# Patient Record
Sex: Male | Born: 1955
Health system: Southern US, Community
[De-identification: ages and names within clinical notes are randomized; demographics above are authoritative.]

## PROBLEM LIST (undated history)

## (undated) DIAGNOSIS — N200 Calculus of kidney: Secondary | ICD-10-CM

## (undated) DIAGNOSIS — I2510 Atherosclerotic heart disease of native coronary artery without angina pectoris: Secondary | ICD-10-CM

## (undated) DIAGNOSIS — A159 Respiratory tuberculosis unspecified: Secondary | ICD-10-CM

## (undated) DIAGNOSIS — D126 Benign neoplasm of colon, unspecified: Secondary | ICD-10-CM

## (undated) DIAGNOSIS — N138 Other obstructive and reflux uropathy: Secondary | ICD-10-CM

## (undated) DIAGNOSIS — H6123 Impacted cerumen, bilateral: Secondary | ICD-10-CM

## (undated) DIAGNOSIS — E785 Hyperlipidemia, unspecified: Secondary | ICD-10-CM

## (undated) DIAGNOSIS — H9190 Unspecified hearing loss, unspecified ear: Secondary | ICD-10-CM

## (undated) DIAGNOSIS — N401 Enlarged prostate with lower urinary tract symptoms: Secondary | ICD-10-CM

## (undated) DIAGNOSIS — Z87442 Personal history of urinary calculi: Secondary | ICD-10-CM

## (undated) DIAGNOSIS — Z9889 Other specified postprocedural states: Secondary | ICD-10-CM

## (undated) HISTORY — DX: Benign neoplasm of colon, unspecified: D12.6

## (undated) HISTORY — DX: Unspecified hearing loss, unspecified ear: H91.90

## (undated) HISTORY — DX: Calculus of kidney: N20.0

## (undated) HISTORY — DX: Impacted cerumen, bilateral: H61.23

## (undated) HISTORY — DX: Hyperlipidemia, unspecified: E78.5

## (undated) HISTORY — DX: Benign prostatic hyperplasia with lower urinary tract symptoms: N40.1

## (undated) HISTORY — DX: Benign prostatic hyperplasia with lower urinary tract symptoms: N13.8

---

## 2013-09-08 ENCOUNTER — Other Ambulatory Visit (HOSPITAL_COMMUNITY): Payer: Self-pay | Admitting: Internal Medicine

## 2013-09-08 ENCOUNTER — Ambulatory Visit (HOSPITAL_COMMUNITY)
Admission: RE | Admit: 2013-09-08 | Discharge: 2013-09-08 | Disposition: A | Discharge status: Home / Self Care | Payer: BC Managed Care – PPO | Source: Ambulatory Visit | Attending: Vascular Surgery | Admitting: Vascular Surgery

## 2013-09-08 DIAGNOSIS — Z0000 Encounter for general adult medical examination without abnormal findings: Secondary | ICD-10-CM | POA: Insufficient documentation

## 2013-09-08 DIAGNOSIS — Z8249 Family history of ischemic heart disease and other diseases of the circulatory system: Secondary | ICD-10-CM | POA: Insufficient documentation

## 2013-10-14 ENCOUNTER — Encounter (HOSPITAL_COMMUNITY): Payer: Self-pay | Admitting: Emergency Medicine

## 2013-10-14 ENCOUNTER — Emergency Department (HOSPITAL_COMMUNITY): Payer: BC Managed Care – PPO

## 2013-10-14 ENCOUNTER — Emergency Department (HOSPITAL_COMMUNITY)
Admission: EM | Admit: 2013-10-14 | Discharge: 2013-10-14 | Disposition: A | Discharge status: Home / Self Care | Payer: BC Managed Care – PPO | Attending: Emergency Medicine | Admitting: Emergency Medicine

## 2013-10-14 DIAGNOSIS — N23 Unspecified renal colic: Secondary | ICD-10-CM | POA: Diagnosis not present

## 2013-10-14 DIAGNOSIS — R112 Nausea with vomiting, unspecified: Secondary | ICD-10-CM | POA: Diagnosis not present

## 2013-10-14 DIAGNOSIS — Z87442 Personal history of urinary calculi: Secondary | ICD-10-CM | POA: Diagnosis not present

## 2013-10-14 DIAGNOSIS — N200 Calculus of kidney: Secondary | ICD-10-CM | POA: Insufficient documentation

## 2013-10-14 DIAGNOSIS — M549 Dorsalgia, unspecified: Secondary | ICD-10-CM | POA: Diagnosis not present

## 2013-10-14 DIAGNOSIS — Z87891 Personal history of nicotine dependence: Secondary | ICD-10-CM | POA: Insufficient documentation

## 2013-10-14 HISTORY — DX: Calculus of kidney: N20.0

## 2013-10-14 LAB — URINALYSIS, ROUTINE W REFLEX MICROSCOPIC
Bilirubin Urine: NEGATIVE
Glucose, UA: NEGATIVE mg/dL
Ketones, ur: NEGATIVE mg/dL
LEUKOCYTES UA: NEGATIVE
Nitrite: NEGATIVE
PROTEIN: NEGATIVE mg/dL
SPECIFIC GRAVITY, URINE: 1.016 (ref 1.005–1.030)
UROBILINOGEN UA: 0.2 mg/dL (ref 0.0–1.0)
pH: 8 (ref 5.0–8.0)

## 2013-10-14 LAB — CBC WITH DIFFERENTIAL/PLATELET
BASOS PCT: 0 % (ref 0–1)
Basophils Absolute: 0 10*3/uL (ref 0.0–0.1)
EOS PCT: 0 % (ref 0–5)
Eosinophils Absolute: 0 10*3/uL (ref 0.0–0.7)
HEMATOCRIT: 45.4 % (ref 39.0–52.0)
Hemoglobin: 15.7 g/dL (ref 13.0–17.0)
LYMPHS PCT: 9 % — AB (ref 12–46)
Lymphs Abs: 0.9 10*3/uL (ref 0.7–4.0)
MCH: 29.8 pg (ref 26.0–34.0)
MCHC: 34.6 g/dL (ref 30.0–36.0)
MCV: 86.1 fL (ref 78.0–100.0)
MONO ABS: 0.2 10*3/uL (ref 0.1–1.0)
Monocytes Relative: 2 % — ABNORMAL LOW (ref 3–12)
Neutro Abs: 8.6 10*3/uL — ABNORMAL HIGH (ref 1.7–7.7)
Neutrophils Relative %: 89 % — ABNORMAL HIGH (ref 43–77)
PLATELETS: 220 10*3/uL (ref 150–400)
RBC: 5.27 MIL/uL (ref 4.22–5.81)
RDW: 12.1 % (ref 11.5–15.5)
WBC: 9.7 10*3/uL (ref 4.0–10.5)

## 2013-10-14 LAB — COMPREHENSIVE METABOLIC PANEL
ALT: 18 U/L (ref 0–53)
AST: 27 U/L (ref 0–37)
Albumin: 4.1 g/dL (ref 3.5–5.2)
Alkaline Phosphatase: 81 U/L (ref 39–117)
Anion gap: 13 (ref 5–15)
BUN: 19 mg/dL (ref 6–23)
CO2: 25 meq/L (ref 19–32)
CREATININE: 1.33 mg/dL (ref 0.50–1.35)
Calcium: 9.5 mg/dL (ref 8.4–10.5)
Chloride: 100 mEq/L (ref 96–112)
GFR calc Af Amer: 66 mL/min — ABNORMAL LOW (ref >90)
GFR, EST NON AFRICAN AMERICAN: 57 mL/min — AB (ref >90)
Glucose, Bld: 140 mg/dL — ABNORMAL HIGH (ref 70–99)
Potassium: 4.2 mEq/L (ref 3.7–5.3)
SODIUM: 138 meq/L (ref 137–147)
Total Bilirubin: 0.5 mg/dL (ref 0.3–1.2)
Total Protein: 7.7 g/dL (ref 6.0–8.3)

## 2013-10-14 LAB — URINE MICROSCOPIC-ADD ON

## 2013-10-14 MED ORDER — ONDANSETRON 4 MG PO TBDP: ORAL_TABLET | ORAL | Status: DC

## 2013-10-14 MED ORDER — HYDROMORPHONE HCL PF 1 MG/ML IJ SOLN
1.0000 mg | Freq: Once | INTRAMUSCULAR | Status: AC
  Administered 2013-10-14: 1 mg via INTRAVENOUS
  Filled 2013-10-14: qty 1

## 2013-10-14 MED ORDER — TAMSULOSIN HCL 0.4 MG PO CAPS: 0.8000 mg | ORAL_CAPSULE | Freq: Every day | ORAL | Status: DC

## 2013-10-14 MED ORDER — HYDROMORPHONE HCL PF 1 MG/ML IJ SOLN: 1.0000 mg | Freq: Once | INTRAMUSCULAR | Status: DC

## 2013-10-14 MED ORDER — TAMSULOSIN HCL 0.4 MG PO CAPS
0.8000 mg | ORAL_CAPSULE | Freq: Every day | ORAL | Status: DC
  Administered 2013-10-14: 0.8 mg via ORAL
  Filled 2013-10-14: qty 2

## 2013-10-14 MED ORDER — KETOROLAC TROMETHAMINE 30 MG/ML IJ SOLN
30.0000 mg | Freq: Once | INTRAMUSCULAR | Status: AC
  Administered 2013-10-14: 30 mg via INTRAVENOUS
  Filled 2013-10-14: qty 1

## 2013-10-14 MED ORDER — ONDANSETRON HCL 4 MG/2ML IJ SOLN
4.0000 mg | Freq: Once | INTRAMUSCULAR | Status: AC
  Administered 2013-10-14: 4 mg via INTRAVENOUS
  Filled 2013-10-14: qty 2

## 2013-10-14 MED ORDER — TAMSULOSIN HCL 0.4 MG PO CAPS: 0.4000 mg | ORAL_CAPSULE | Freq: Every day | ORAL | Status: DC

## 2013-10-14 MED ORDER — OXYCODONE-ACETAMINOPHEN 5-325 MG PO TABS: 1.0000 | ORAL_TABLET | ORAL | Status: DC | PRN

## 2013-10-14 NOTE — ED Provider Notes (Signed)
CSN: [id number]     Arrival date & time 10/14/13  1006 History   First MD Initiated Contact with Patient 10/14/13 1012     Chief Complaint  Patient presents with  . Nephrolithiasis    HPI BRENTTON WARDLOW is a 58 y.o. male with PMH of nephrolithiasis complaining of severe left flank pain that radiates into his penis and left testicle. Pain started a few hours ago and is not increasing or decreasing. Patient has severe nausea and has been vomiting since he woke up this morning. Emesis is nonbloody nonbilious. Patient last had a kidney stone 7 years ago. He had a KUB which showed multiple stones in the kidneys. No history of obstruction or stent. Patient denies other abdominal pain, dysuria hematuria or frequency. No chest pain, shortness of breath, weakness, headaches. Patient states Toradol as worked for him in the past for kidney stone pain.  Past Medical History  Diagnosis Date  . Kidney stones    History reviewed. No pertinent past surgical history. No family history on file. History  Substance Use Topics  . Smoking status: Former Research scientist (life sciences)  . Smokeless tobacco: Not on file  . Alcohol Use: 4.2 oz/week    7 Cans of beer per week    Review of Systems  Constitutional: Negative for fever and chills.  HENT: Negative for congestion and rhinorrhea.   Eyes: Negative for visual disturbance.  Respiratory: Negative for cough and shortness of breath.   Cardiovascular: Negative for chest pain and palpitations.  Gastrointestinal: Positive for nausea and vomiting. Negative for diarrhea and blood in stool.  Genitourinary: Negative for dysuria, hematuria, difficulty urinating and penile pain.  Musculoskeletal: Positive for back pain. Negative for gait problem.  Skin: Negative for rash.  Neurological: Negative for weakness and headaches.      Allergies  Review of patient's allergies indicates no known allergies.  Home Medications   Prior to Admission medications   Medication Sig Start Date  End Date Taking? Authorizing Provider  ketorolac (TORADOL) 10 MG tablet Take 10 mg by mouth every 6 (six) hours as needed for moderate pain.   Yes Historical Provider, MD  ondansetron (ZOFRAN ODT) 4 MG disintegrating tablet 4mg  ODT q4 hours prn nausea/vomit 10/14/13   Margarita Mail, PA-C  oxyCODONE-acetaminophen (PERCOCET) 5-325 MG per tablet Take 1-2 tablets by mouth every 4 (four) hours as needed. 10/14/13   Margarita Mail, PA-C  tamsulosin (FLOMAX) 0.4 MG CAPS capsule Take 1 capsule (0.4 mg total) by mouth daily after supper. 10/14/13   Margarita Mail, PA-C   BP 123/73  Pulse 56  Temp(Src) 97.3 F (36.3 C) (Oral)  Resp 18  Ht 5' 11.5" (1.816 m)  Wt 190 lb (86.183 kg)  BMI 26.13 kg/m2  SpO2 96% Physical Exam  Nursing note and vitals reviewed. Constitutional: He appears well-developed and well-nourished. No distress.  HENT:  Head: Normocephalic and atraumatic.  Eyes: Conjunctivae and EOM are normal. Right eye exhibits no discharge. Left eye exhibits no discharge. No scleral icterus.  Cardiovascular: Normal rate, regular rhythm and normal heart sounds.   Pulmonary/Chest: Effort normal. No respiratory distress.  Abdominal: Soft. Bowel sounds are normal. He exhibits no distension. There is no tenderness.  Left CVA tenderness.  Musculoskeletal: Normal range of motion. He exhibits no tenderness.  Neurological: He is alert. He exhibits normal muscle tone. Coordination normal.  Skin: Skin is warm and dry. He is not diaphoretic.  Psychiatric: He has a normal mood and affect. His behavior is normal.  ED Course  Procedures (including critical care time) Labs Review Labs Reviewed  CBC WITH DIFFERENTIAL - Abnormal; Notable for the following:    Neutrophils Relative % 89 (*)    Neutro Abs 8.6 (*)    Lymphocytes Relative 9 (*)    Monocytes Relative 2 (*)    All other components within normal limits  COMPREHENSIVE METABOLIC PANEL - Abnormal; Notable for the following:    Glucose, Bld 140  (*)    GFR calc non Af Amer 57 (*)    GFR calc Af Amer 66 (*)    All other components within normal limits  URINALYSIS, ROUTINE W REFLEX MICROSCOPIC - Abnormal; Notable for the following:    APPearance CLOUDY (*)    Hgb urine dipstick SMALL (*)    All other components within normal limits  URINE MICROSCOPIC-ADD ON    Imaging Review US Renal  10/14/2013   CLINICAL DATA:  Nephrolithiasis.  EXAM: RENAL/URINARY TRACT ULTRASOUND COMPLETE  COMPARISON:  None.  FINDINGS: Right Kidney:  Length: 11.0 cm. The central sinus echo complex is normal without hydronephrosis. 3 mm calculus is present in the inferior pole collecting system.  Left Kidney:  Length: 12.0 cm. Normal central sinus echo complex without hydronephrosis. 6 mm calculus is present in the inferior pole collecting system, with faint posterior acoustic shadowing.  Bladder:  Appears normal for degree of bladder distention.  IMPRESSION: Small sub cm bilateral renal collecting system calculi. No hydronephrosis.   Electronically Signed   By: Dereck Ligas M.D.   On: 10/14/2013 13:31     EKG Interpretation None     Meds given in ED:  Medications  ondansetron (ZOFRAN) injection 4 mg (4 mg Intravenous Given 10/14/13 1115)  HYDROmorphone (DILAUDID) injection 1 mg (1 mg Intravenous Given 10/14/13 1115)  ketorolac (TORADOL) 30 MG/ML injection 30 mg (30 mg Intravenous Given 10/14/13 1351)    Discharge Medication List as of 10/14/2013  1:26 PM    START taking these medications   Details  ondansetron (ZOFRAN ODT) 4 MG disintegrating tablet 4mg  ODT q4 hours prn nausea/vomit, Print    oxyCODONE-acetaminophen (PERCOCET) 5-325 MG per tablet Take 1-2 tablets by mouth every 4 (four) hours as needed., Starting 10/14/2013, Until Discontinued, Print    tamsulosin (FLOMAX) 0.4 MG CAPS capsule Take 1 capsule (0.4 mg total) by mouth daily after supper., Starting 10/14/2013, Until Discontinued, Print          MDM   Final diagnoses:  Ureteral colic    Patient is a 58 year old male with past medical history of nephrolithiasis complaining of severe left flank pain that radiates into his penis and left testicle. Patient has a history of kidney stones 7 years ago. He never needed any type of stent or hydronephrosis. Patient is afebrile with normal vitals. Patient given Zofran and dilaudid with resolution of pain and nausea. UA without signs of infection. Renal US without sounds of hydronephrosis. Small bilateral calculi in renal collecting system. Patient tolerated PO challenge. Patient is afebrile, nontoxic, and in no acute distress. Patient is appropriate for outpatient management and is stable for discharge. Patient given toradol for longer lasting pain relief. Discharged with Percocet, zofran, and flomax.   Discussed return precautions with patient. Discussed all results and patient verbalizes understanding and agrees with plan.     Pura Spice, PA-C 10/14/13 1801

## 2013-10-14 NOTE — ED Notes (Signed)
Patient is from home. Patient states he has been having N/V and pain left flank radiating to penis and left testicle.

## 2013-10-14 NOTE — Discharge Instructions (Signed)
Return to the ED immediately if you develop fever, uncontrolled pain or vomiting, or other concerns.   Kidney Stones Kidney stones (urolithiasis) are deposits that form inside your kidneys. The intense pain is caused by the stone moving through the urinary tract. When the stone moves, the ureter goes into spasm around the stone. The stone is usually passed in the urine.  CAUSES   A disorder that makes certain neck glands produce too much parathyroid hormone (primary hyperparathyroidism).  A buildup of uric acid crystals, similar to gout in your joints.  Narrowing (stricture) of the ureter.  A kidney obstruction present at birth (congenital obstruction).  Previous surgery on the kidney or ureters.  Numerous kidney infections. SYMPTOMS   Feeling sick to your stomach (nauseous).  Throwing up (vomiting).  Blood in the urine (hematuria).  Pain that usually spreads (radiates) to the groin.  Frequency or urgency of urination. DIAGNOSIS   Taking a history and physical exam.  Blood or urine tests.  CT scan.  Occasionally, an examination of the inside of the urinary bladder (cystoscopy) is performed. TREATMENT   Observation.  Increasing your fluid intake.  Extracorporeal shock wave lithotripsy--This is a noninvasive procedure that uses shock waves to break up kidney stones.  Surgery may be needed if you have severe pain or persistent obstruction. There are various surgical procedures. Most of the procedures are performed with the use of small instruments. Only small incisions are needed to accommodate these instruments, so recovery time is minimized. The size, location, and chemical composition are all important variables that will determine the proper choice of action for you. Talk to your health care provider to better understand your situation so that you will minimize the risk of injury to yourself and your kidney.  HOME CARE INSTRUCTIONS   Drink enough water and fluids to  keep your urine clear or pale yellow. This will help you to pass the stone or stone fragments.  Strain all urine through the provided strainer. Keep all particulate matter and stones for your health care provider to see. The stone causing the pain may be as small as a grain of salt. It is very important to use the strainer each and every time you pass your urine. The collection of your stone will allow your health care provider to analyze it and verify that a stone has actually passed. The stone analysis will often identify what you can do to reduce the incidence of recurrences.  Only take over-the-counter or prescription medicines for pain, discomfort, or fever as directed by your health care provider.  Make a follow-up appointment with your health care provider as directed.  Get follow-up X-rays if required. The absence of pain does not always mean that the stone has passed. It may have only stopped moving. If the urine remains completely obstructed, it can cause loss of kidney function or even complete destruction of the kidney. It is your responsibility to make sure X-rays and follow-ups are completed. Ultrasounds of the kidney can show blockages and the status of the kidney. Ultrasounds are not associated with any radiation and can be performed easily in a matter of minutes. SEEK MEDICAL CARE IF:  You experience pain that is progressive and unresponsive to any pain medicine you have been prescribed. SEEK IMMEDIATE MEDICAL CARE IF:   Pain cannot be controlled with the prescribed medicine.  You have a fever or shaking chills.  The severity or intensity of pain increases over 18 hours and is not relieved by  pain medicine.  You develop a new onset of abdominal pain.  You feel faint or pass out.  You are unable to urinate. MAKE SURE YOU:   Understand these instructions.  Will watch your condition.  Will get help right away if you are not doing well or get worse. Document Released:  02/05/2005 Document Revised: 10/08/2012 Document Reviewed: 07/09/2012 [facility] Patient Information 2015 [place], [place]. This information is not intended to replace advice given to you by your health care provider. Make sure you discuss any questions you have with your health care provider.

## 2013-10-16 ENCOUNTER — Encounter (HOSPITAL_COMMUNITY): Admission: EM | Disposition: A | Discharge status: Home / Self Care | Payer: Self-pay | Source: Ambulatory Visit

## 2013-10-16 ENCOUNTER — Emergency Department (HOSPITAL_COMMUNITY): Admission: EM | Admit: 2013-10-16 | Payer: BC Managed Care – PPO

## 2013-10-16 ENCOUNTER — Ambulatory Visit (HOSPITAL_COMMUNITY): Payer: BC Managed Care – PPO | Admitting: Registered Nurse

## 2013-10-16 ENCOUNTER — Ambulatory Visit: Payer: Self-pay | Admitting: Urology

## 2013-10-16 ENCOUNTER — Encounter (HOSPITAL_COMMUNITY): Payer: BC Managed Care – PPO | Admitting: Registered Nurse

## 2013-10-16 ENCOUNTER — Ambulatory Visit (HOSPITAL_COMMUNITY)
Admission: EM | Admit: 2013-10-16 | Discharge: 2013-10-16 | Disposition: A | Discharge status: Home / Self Care | Payer: BC Managed Care – PPO | Source: Ambulatory Visit | Attending: Urology | Admitting: Urology

## 2013-10-16 ENCOUNTER — Encounter (HOSPITAL_COMMUNITY): Payer: Self-pay | Admitting: Registered Nurse

## 2013-10-16 DIAGNOSIS — Z79899 Other long term (current) drug therapy: Secondary | ICD-10-CM | POA: Diagnosis not present

## 2013-10-16 DIAGNOSIS — N201 Calculus of ureter: Secondary | ICD-10-CM | POA: Diagnosis present

## 2013-10-16 DIAGNOSIS — Z87891 Personal history of nicotine dependence: Secondary | ICD-10-CM | POA: Diagnosis not present

## 2013-10-16 DIAGNOSIS — Z7982 Long term (current) use of aspirin: Secondary | ICD-10-CM | POA: Insufficient documentation

## 2013-10-16 DIAGNOSIS — N135 Crossing vessel and stricture of ureter without hydronephrosis: Secondary | ICD-10-CM | POA: Insufficient documentation

## 2013-10-16 DIAGNOSIS — R7611 Nonspecific reaction to tuberculin skin test without active tuberculosis: Secondary | ICD-10-CM | POA: Insufficient documentation

## 2013-10-16 HISTORY — PX: HOLMIUM LASER APPLICATION: SHX5852

## 2013-10-16 HISTORY — PX: CYSTOSCOPY WITH RETROGRADE PYELOGRAM, URETEROSCOPY AND STENT PLACEMENT: SHX5789

## 2013-10-16 SURGERY — CYSTOURETEROSCOPY, WITH RETROGRADE PYELOGRAM AND STENT INSERTION
Anesthesia: General | Laterality: Bilateral

## 2013-10-16 MED ORDER — FENTANYL CITRATE 0.05 MG/ML IJ SOLN
25.0000 ug | INTRAMUSCULAR | Status: DC | PRN
  Administered 2013-10-16 (×2): 25 ug via INTRAVENOUS

## 2013-10-16 MED ORDER — TAMSULOSIN HCL 0.4 MG PO CAPS
0.4000 mg | ORAL_CAPSULE | Freq: Once | ORAL | Status: DC
  Filled 2013-10-16: qty 1

## 2013-10-16 MED ORDER — LIDOCAINE HCL 2 % EX GEL
CUTANEOUS | Status: DC | PRN
  Administered 2013-10-16: 1 via URETHRAL

## 2013-10-16 MED ORDER — ACETAMINOPHEN 10 MG/ML IV SOLN
INTRAVENOUS | Status: DC | PRN
  Administered 2013-10-16: 1000 mg via INTRAVENOUS

## 2013-10-16 MED ORDER — FENTANYL CITRATE 0.05 MG/ML IJ SOLN
INTRAMUSCULAR | Status: AC
  Filled 2013-10-16: qty 5

## 2013-10-16 MED ORDER — OXYCODONE HCL 5 MG/5ML PO SOLN: 5.0000 mg | Freq: Once | ORAL | Status: AC | PRN

## 2013-10-16 MED ORDER — PHENAZOPYRIDINE HCL 200 MG PO TABS
200.0000 mg | ORAL_TABLET | Freq: Once | ORAL | Status: AC
  Administered 2013-10-16: 200 mg via ORAL

## 2013-10-16 MED ORDER — DEXAMETHASONE SODIUM PHOSPHATE 10 MG/ML IJ SOLN
INTRAMUSCULAR | Status: DC | PRN
  Administered 2013-10-16: 10 mg via INTRAVENOUS

## 2013-10-16 MED ORDER — MEPERIDINE HCL 50 MG/ML IJ SOLN: 6.2500 mg | INTRAMUSCULAR | Status: DC | PRN

## 2013-10-16 MED ORDER — ONDANSETRON HCL 4 MG/2ML IJ SOLN
INTRAMUSCULAR | Status: AC
  Filled 2013-10-16: qty 2

## 2013-10-16 MED ORDER — IOHEXOL 300 MG/ML  SOLN
INTRAMUSCULAR | Status: DC | PRN
  Administered 2013-10-16: 9 mL via URETHRAL

## 2013-10-16 MED ORDER — ACETAMINOPHEN 10 MG/ML IV SOLN
1000.0000 mg | Freq: Once | INTRAVENOUS | Status: DC
  Filled 2013-10-16: qty 100

## 2013-10-16 MED ORDER — LIDOCAINE HCL (CARDIAC) 20 MG/ML IV SOLN
INTRAVENOUS | Status: AC
  Filled 2013-10-16: qty 5

## 2013-10-16 MED ORDER — MIDAZOLAM HCL 5 MG/5ML IJ SOLN
INTRAMUSCULAR | Status: DC | PRN
  Administered 2013-10-16: 2 mg via INTRAVENOUS

## 2013-10-16 MED ORDER — LACTATED RINGERS IV SOLN
INTRAVENOUS | Status: DC | PRN
  Administered 2013-10-16 (×2): via INTRAVENOUS

## 2013-10-16 MED ORDER — LIDOCAINE HCL 2 % EX GEL
CUTANEOUS | Status: AC
  Filled 2013-10-16: qty 10

## 2013-10-16 MED ORDER — PROPOFOL 10 MG/ML IV BOLUS
INTRAVENOUS | Status: AC
  Filled 2013-10-16: qty 20

## 2013-10-16 MED ORDER — SODIUM CHLORIDE 0.9 % IJ SOLN
INTRAMUSCULAR | Status: AC
  Filled 2013-10-16: qty 10

## 2013-10-16 MED ORDER — PHENAZOPYRIDINE HCL 200 MG PO TABS
ORAL_TABLET | ORAL | Status: AC
  Filled 2013-10-16: qty 1

## 2013-10-16 MED ORDER — KETOROLAC TROMETHAMINE 30 MG/ML IJ SOLN
INTRAMUSCULAR | Status: DC | PRN
  Administered 2013-10-16: 30 mg via INTRAVENOUS

## 2013-10-16 MED ORDER — CIPROFLOXACIN IN D5W 400 MG/200ML IV SOLN
INTRAVENOUS | Status: DC | PRN
  Administered 2013-10-16: 400 mg via INTRAVENOUS

## 2013-10-16 MED ORDER — BELLADONNA ALKALOIDS-OPIUM 16.2-60 MG RE SUPP
RECTAL | Status: DC | PRN
  Administered 2013-10-16: 1 via RECTAL

## 2013-10-16 MED ORDER — OXYCODONE-ACETAMINOPHEN 10-325 MG PO TABS: 1.0000 | ORAL_TABLET | ORAL | Status: DC | PRN

## 2013-10-16 MED ORDER — FENTANYL CITRATE 0.05 MG/ML IJ SOLN
INTRAMUSCULAR | Status: DC | PRN
  Administered 2013-10-16 (×5): 50 ug via INTRAVENOUS

## 2013-10-16 MED ORDER — OXYCODONE HCL 5 MG PO TABS
5.0000 mg | ORAL_TABLET | Freq: Once | ORAL | Status: AC | PRN
Start: 2013-10-16 — End: 2013-10-16
  Administered 2013-10-16: 5 mg via ORAL

## 2013-10-16 MED ORDER — BELLADONNA ALKALOIDS-OPIUM 16.2-60 MG RE SUPP
RECTAL | Status: AC
  Filled 2013-10-16: qty 1

## 2013-10-16 MED ORDER — CIPROFLOXACIN IN D5W 400 MG/200ML IV SOLN
INTRAVENOUS | Status: AC
  Filled 2013-10-16: qty 200

## 2013-10-16 MED ORDER — LIDOCAINE HCL (CARDIAC) 10 MG/ML IV SOLN
INTRAVENOUS | Status: DC | PRN
  Administered 2013-10-16: 100 mg via INTRAVENOUS

## 2013-10-16 MED ORDER — SODIUM CHLORIDE 0.9 % IR SOLN
Status: DC | PRN
  Administered 2013-10-16: 4000 mL

## 2013-10-16 MED ORDER — PROPOFOL 10 MG/ML IV BOLUS
INTRAVENOUS | Status: DC | PRN
  Administered 2013-10-16: 200 mg via INTRAVENOUS

## 2013-10-16 MED ORDER — OXYCODONE HCL 5 MG PO TABS
ORAL_TABLET | ORAL | Status: AC
  Filled 2013-10-16: qty 1

## 2013-10-16 MED ORDER — ONDANSETRON HCL 4 MG/2ML IJ SOLN
INTRAMUSCULAR | Status: DC | PRN
  Administered 2013-10-16: 4 mg via INTRAVENOUS

## 2013-10-16 MED ORDER — DEXAMETHASONE SODIUM PHOSPHATE 10 MG/ML IJ SOLN
INTRAMUSCULAR | Status: AC
  Filled 2013-10-16: qty 1

## 2013-10-16 MED ORDER — PROMETHAZINE HCL 25 MG/ML IJ SOLN: 6.2500 mg | INTRAMUSCULAR | Status: DC | PRN

## 2013-10-16 MED ORDER — PHENAZOPYRIDINE HCL 200 MG PO TABS: 200.0000 mg | ORAL_TABLET | Freq: Three times a day (TID) | ORAL | Status: DC | PRN

## 2013-10-16 MED ORDER — FENTANYL CITRATE 0.05 MG/ML IJ SOLN
INTRAMUSCULAR | Status: AC
  Filled 2013-10-16: qty 2

## 2013-10-16 MED ORDER — MIDAZOLAM HCL 2 MG/2ML IJ SOLN
INTRAMUSCULAR | Status: AC
  Filled 2013-10-16: qty 2

## 2013-10-16 SURGICAL SUPPLY — 19 items
BAG URO CATCHER STRL LF (DRAPE) ×2 IMPLANT
BASKET ZERO TIP NITINOL 2.4FR (BASKET) IMPLANT
CATH INTERMIT  6FR 70CM (CATHETERS) ×2 IMPLANT
CLOTH BEACON ORANGE TIMEOUT ST (SAFETY) ×2 IMPLANT
DRAPE CAMERA CLOSED 9X96 (DRAPES) ×2 IMPLANT
EXTRACTOR STONE NITINOL NGAGE (UROLOGICAL SUPPLIES) ×2 IMPLANT
FIBER LASER FLEXIVA 200 (UROLOGICAL SUPPLIES) ×2 IMPLANT
GLOVE BIOGEL M 8.0 STRL (GLOVE) ×6 IMPLANT
GOWN STRL REUS W/ TWL XL LVL3 (GOWN DISPOSABLE) ×1 IMPLANT
GOWN STRL REUS W/TWL XL LVL3 (GOWN DISPOSABLE) ×3 IMPLANT
GUIDEWIRE ANG ZIPWIRE 038X150 (WIRE) IMPLANT
GUIDEWIRE STR DUAL SENSOR (WIRE) ×4 IMPLANT
KIT BALLIN UROMAX 15FX10 (LABEL) ×1 IMPLANT
MANIFOLD NEPTUNE II (INSTRUMENTS) ×2 IMPLANT
PACK CYSTO (CUSTOM PROCEDURE TRAY) ×2 IMPLANT
SET HIGH PRES BAL DIL (LABEL) ×1
SHEATH ACCESS URETERAL 38CM (SHEATH) ×2 IMPLANT
STENT URET 6FRX24 CONTOUR (STENTS) ×4 IMPLANT
TUBING CONNECTING 10 (TUBING) ×2 IMPLANT

## 2013-10-16 NOTE — Anesthesia Preprocedure Evaluation (Addendum)
Anesthesia Evaluation  Patient identified by MRN, date of birth, ID band Patient awake    Reviewed: Allergy & Precautions, H&P , NPO status , Patient's Chart, lab work & pertinent test results  Airway Mallampati: I TM Distance: >3 FB Neck ROM: Full    Dental no notable dental hx. (+) Teeth Intact   Pulmonary former smoker,  breath sounds clear to auscultation  Pulmonary exam normal       Cardiovascular negative cardio ROS  Rhythm:Regular Rate:Normal     Neuro/Psych negative neurological ROS  negative psych ROS   GI/Hepatic negative GI ROS, Neg liver ROS,   Endo/Other  negative endocrine ROS  Renal/GU Renal diseaseHx/o renal calculi Bilateral ureteral calculi  negative genitourinary   Musculoskeletal negative musculoskeletal ROS (+)   Abdominal   Peds negative pediatric ROS (+)  Hematology negative hematology ROS (+)   Anesthesia Other Findings   Reproductive/Obstetrics negative OB ROS                          Anesthesia Physical Anesthesia Plan  ASA: II  Anesthesia Plan: General   Post-op Pain Management:    Induction: Intravenous  Airway Management Planned: LMA  Additional Equipment:   Intra-op Plan:   Post-operative Plan: Extubation in OR  Informed Consent: I have reviewed the patients History and Physical, chart, labs and discussed the procedure including the risks, benefits and alternatives for the proposed anesthesia with the patient or authorized representative who has indicated his/her understanding and acceptance.   Dental advisory given  Plan Discussed with: CRNA, Anesthesiologist and Surgeon  Anesthesia Plan Comments:        Anesthesia Quick Evaluation

## 2013-10-16 NOTE — ED Provider Notes (Signed)
Medical screening examination/treatment/procedure(s) were performed by non-physician practitioner and as supervising physician I was immediately available for consultation/collaboration.   Leota Jacobsen, MD 10/16/13 [phone]

## 2013-10-16 NOTE — H&P (Signed)
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 He was last seen in 2010. He has been asymptomatic since passing his last stone approximately 10 years ago until 2 days ago when he developed severe left-sided flank pain with radiation to his left lower quadrant and left testis. He presented to the emergency department where he underwent a renal ultrasound which did not demonstrate hydronephrosis but did demonstrate some left-sided perinephric fluid consistent with a possible forniceal rupture. He did not undergo any further imaging at that time. He was treated symptomatically and his pain has been relatively well-controlled over the last 2 days with Percocet and Zofran. He has denied any fever. He denies any right-sided flank pain or abdominal pain.   Past Medical History Problems  1. History of kidney stones (V13.01) 2. History of Tuberculin PPD Induration Positive Interpretation (795.5)  Surgical History Problems  1. History of Colonoscopy (Fiberoptic) 2. History of Neck Surgery  Current Meds 1. Aspirin 81 MG Oral Tablet;  Therapy: (Recorded:18Nov2010) to Recorded 2. Tamsulosin HCl - 0.4 MG Oral Capsule;  Therapy: (Recorded:28Aug2015) to Recorded 3. Toradol Oral 10 MG TABS (Ketorolac Tromethamine);  Therapy: (Recorded:28Aug2015) to Recorded  Allergies Medication  1. No Known Drug Allergies  Family History Problems  1. Family history of Colon Cancer (V16.0) : Father 2. Family history of Prostate Cancer (K53.97) : Father  Social History Problems    Alcohol Use   1 or 2 a week   Former smoker [place])   Marital History - Currently Married   Occupation:   Psychologist, sport and exercise   Tobacco use (305.1)   1/4 pk a day for 6 yrs; quit 33 yrs ago  Review of Systems  Genitourinary: no hematuria.  Gastrointestinal: nausea.  Constitutional: no fever.   Hematologic/Lymphatic: no tendency to easily bruise.  Cardiovascular: no leg swelling.    Vitals Vital Signs [Data Includes: Last 1 Day]  Recorded: 28Aug2015 04:43PM  Height: 5 ft 11.5 in Weight: 188 lb  BMI Calculated: 25.86 BSA Calculated: 2.06  Physical Exam Constitutional: Well nourished and well developed . No acute distress.  ENT:. The ears and nose are normal in appearance.  Neck: The appearance of the neck is normal and no neck mass is present.  Pulmonary: No respiratory distress, normal respiratory rhythm and effort and clear bilateral breath sounds.  Cardiovascular: Heart rate and rhythm are normal . No peripheral edema.  Abdomen: The abdomen is soft and nontender. No masses are palpated. No CVA tenderness. No hernias are palpable. No hepatosplenomegaly noted.  Genitourinary: Examination of the penis demonstrates no discharge, no masses, no lesions and a normal meatus. The scrotum is without lesions. The right epididymis is palpably normal and non-tender. The left epididymis is palpably normal and non-tender. The right testis is non-tender and without masses. The left testis is non-tender and without masses.  Lymphatics: The femoral and inguinal nodes are not enlarged or tender.  Skin: Normal skin turgor, no visible rash and no visible skin lesions.  Neuro/Psych:. Mood and affect are appropriate.    Results/Data Selected Results  AU CT-STONE PROTOCOL 28Aug2015 12:00AM Raynelle Bring   Test Name Result Flag Reference  CT-STONE PROTOCOL     Diagnostic Images Are Available In PACS For This Exam.    I reviewed his medical records and laboratory results from his emergency room visit on 10/14/13. Serum creatinine was 1.33 and urinalysis demonstrated 7-10 red blood cells.  I independently reviewed a CT stone study today. This demonstrates fairly large burden nonobstructing renal calculi including the largest 1.3 cm left lower pole calculus. He also appears to have a  proximal left ureteral calculus measuring 4-5 mm and a proximal right ureteral calculus measuring approximately 6 mm. Both stones have Hounsfield units of approximately 1000. There is no significant hydronephrosis noted.  Assessment Assessed  1. Ureteral stone (592.1)  Discussion/Summary 1. Bilateral ureteral calculi: I reviewed the CT scan Pilar Plate today. Although he does not have complete obstruction, he does have bilateral ureteral stones and we discussed the risk of developing acute renal failure. We also discussed the probability that he would pass the stone on each side. Considering his desire to avoid time out of work and to avoid further pain symptoms, he does wish to proceed with definitive therapy. We reviewed the potential options including percutaneous therapy, shockwave lithotripsy, and ureteroscopic laser lithotripsy. After reviewing the pros and cons of each approach, he is most interested in proceeding with bilateral ureteroscopic laser lithotripsy. He also understands the increased risk of operating on both renal units. After reviewing the potential risks, complications, expected recovery process as well as the need for postoperative ureteral stenting, he gives his informed consent to proceed. This will be performed tonight. I have discussed this case with Dr. Karsten Ro who is on-call tonight. He is agreeable to perform this procedure.    Cc: Dr. Crist Infante     Signatures Electronically signed by : Raynelle Bring, M.D.; Oct 16 2013  5:14PM EST

## 2013-10-16 NOTE — ED Notes (Signed)
Pt sent to ED for OR check list. OR ready for patient.

## 2013-10-16 NOTE — Op Note (Signed)
PATIENT:  Jeffery Hunter  PRE-OPERATIVE DIAGNOSIS:  Bilateral ureteral calculi  POST-OPERATIVE DIAGNOSIS: 1. Bilateral ureteral calculi 2. Bilateral ureteral strictures  PROCEDURE:  1. . oscopy with bilateral retrograde pyelograms including interpretation. 2. Left ureteroscopy, laser lithotripsy and stone extraction. 3. Right ureteroscopy, laser lithotripsy and stone extraction. 4. 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 A renal ultrasound was performed that revealed no hydronephrosis and no stone was seen. No other imaging studies were performed. He was seen in the office today and a CT scan revealed a 4 mm ureteral stone located at the left ureteropelvic junction. On the right-hand side there was a 6 mm stone located near the ureteropelvic junction as well. He is brought to the operating room today for bilateral ureteroscopic management of his stones.  ANESTHESIA:  General  EBL:  less than 50 mL  DRAINS: 6 French, 24 cm double-J stents in both right and left ureters (both stents have strings)  SPECIMEN:  Stone given to patient  DESCRIPTION OF PROCEDURE: The patient was taken to the major OR and placed on the table. General anesthesia was administered and then the patient was moved to the dorsal lithotomy position. The genitalia was sterilely prepped and draped. An official timeout was performed.  Initially the 30 French cystoscope with 12 lens was passed under direct vision. The bladder was then entered and fully inspected. It was noted be free of any tumors stones or inflammatory lesions. Ureteral orifices were of normal configuration and position. A 6 French open-ended ureteral catheter was then passed through the cystoscope into the ureteral orifice in order to perform a left retrograde pyelogram.  A retrograde pyelogram was performed by injecting full-strength contrast up the  left ureter under direct fluoroscopic control. It revealed a filling defect in the ureter consistent with the stone seen on the preoperative CT. The remainder of the ureter was noted to be normal as was the intrarenal collecting system. I then passed a 0.038 inch floppy-tipped guidewire through the open ended catheter and into the area of the renal pelvis and this was left in place. The inner portion of a ureteral access sheath was then passed over the guidewire to gently dilate the ureter up to the stone but I was unable to pass the inner portion beyond about halfway up the ureter. I therefore removed this and passed the ureteral dilating balloon over the guidewire and positioned at in the location where I was having difficulty advancing the previous access sheath and dilated the ureter. I then removed the balloon and reinserted the access sheath and was able to pass this further but still had some difficulty. I therefore removed the access sheath once again and passed the digital ureteroscope over the guidewire and up the ureter to the point where I was meeting resistance and found an area of whitish scar tissue consistent with ureteral scarring/stricture. I therefore removed the ureteroscope, passed the dilating balloon across this area and dilated. I was then able to pass the access sheath up to the level of the UPJ where the stone was previously located and passed the digital ureteroscope through the access sheath.  I was able to identify the stone and it had fallen back into the renal pelvis. I used a 200  holmium laser fiber to fragment the stone into 2 portions and then used a Nitinol basket to grasp the stone fragments and remove them. I  then passed a guidewire through the access sheath and left this in place removing the access sheath. The guidewire was secured to the drape and I then directed my attention to the right ureter.  A right retrograde pyelogram was performed in an identical fashion as  described above. This also revealed no definite abnormality of the ureter. The stone could be seen as a filling defect again. I then passed a guidewire, attempted to dilate this ureter with the inner portion of a ureteral access sheath and again noted significant resistance at about the mid ureteral level. I therefore dilated this area as well with the ureteral dilating balloon and then was able to pass the access sheath easily up to the level of the stone. I again used the 200  holmium laser fiber to fragment the stone. I grasped the fragments with a Nitinol basket and extracted these without difficulty as well. Again the guidewire was passed into the area the renal pelvis and the access sheath removed.  I then backloaded the cystoscope over the guidewire on the right side and passed the stent over the guidewire into the area of the renal pelvis. As the guidewire was removed good curl was noted in the renal pelvis. I then proceeded to place a stent on the left side in an identical fashion. At the end of the procedure I checked and made sure there was good curl in the renal pelvis on both right and left sides as well as in the bladder. The bladder was drained and the cystoscope was then removed. I instilled 2% lidocaine jelly in the urethra and applied a penile clamp. The tethers were then affixed to the dorsum of the penis with tape. The patient tolerated the procedure well no intraoperative complications.  PLAN OF CARE: Discharge to home after PACU  PATIENT DISPOSITION:  PACU - hemodynamically stable.       And a I

## 2013-10-16 NOTE — Transfer of Care (Signed)
Immediate Anesthesia Transfer of Care Note  Patient: Jeffery Hunter  Procedure(s) Performed: Procedure(s): CYSTOSCOPY WITH BILATERAL RETROGRADE PYELOGRAM, BILATERAL URETEROSCOPY AND bilateral STENT PLACEMENT, bilateral urethral dilation HOLMIUM LASER (Bilateral) HOLMIUM LASER APPLICATION (Bilateral)  Patient Location: PACU  Anesthesia Type:General  Level of Consciousness: awake, alert , oriented and patient cooperative  Airway & Oxygen Therapy: Patient Spontanous Breathing and Patient connected to face mask oxygen  Post-op Assessment: Report given to PACU RN, Post -op Vital signs reviewed and stable and Patient moving all extremities X 4  Post vital signs: stable  Complications: No apparent anesthesia complications

## 2013-10-16 NOTE — Discharge Instructions (Signed)
Post stone removal/stent placement surgery instructions   Definitions:  Ureter: The duct that transports urine from the kidney to the bladder. Stent: A plastic hollow tube that is placed into the ureter, from the kidney to the bladder to prevent the ureter from swelling shut.  General instructions:  Despite the fact that no skin incisions were used, the area around the ureter and bladder is raw and irritated. The stent is a foreign body which will further irritate the bladder wall. This irritation is manifested by increased frequency of urination, both day and night, and by an increase in the urge to urinate. In some, the urge to urinate is present almost always. Sometimes the urge is strong enough that you may not be able to stop your self from urinating. The only real cure is to remove the stent and then give time for the bladder wall to heal which can't be done until the danger of the ureter swelling shut has passed. (This varies from 2-21 days).  You may see some blood in your urine while the stent is in place and a few days afterward. Do not be alarmed, even if the urine is clear for a while. Get off your feet and drink lots of fluids until clearing occurs. If you start to pass clots or don't improve, call us.  If you have a string coming from your urethra:  The stent string is attached to your ureteral stent.  Do not pull on thisIf you have a string coming from your urethra:  The stent string is attached to your ureteral stent.  Do not pull on this.  Diet:  You may return to your normal diet immediately. Because of the raw surface of your bladder, alcohol, spicy foods, foods high in acid and drinks with caffeine may cause irritation or frequency and should be used in moderation. To keep your urine flowing freely and avoid constipation, drink plenty of fluids during the day (8-10 glasses). Tip: Avoid cranberry juice because it is very acidic.  Activity:  Your physical activity doesn't need  to be restricted. However, if you are very active, you may see some blood in the urine. We suggest that you reduce your activity under the circumstances until the bleeding has stopped.  Bowels:  It is important to keep your bowels regular during the postoperative period. Straining with bowel movements can cause bleeding. A bowel movement every other day is reasonable. Use a mild laxative if needed, such as milk of magnesia 2-3 tablespoons, or 2 Dulcolax tablets. Call if you continue to have problems. If you had been taking narcotics for pain, before, during or after your surgery, you may be constipated. Take a laxative if necessary.     Medication:  You should resume your pre-surgery medications unless told not to. DO NOT RESUME YOUR ASPIRIN, or any other medicines like ibuprofen, motrin, excedrin, advil, aleve, vitamin E, fish oil as these can all cause bleeding x 7 days. In addition you may be given an antibiotic to prevent or treat infection. Antibiotics are not always necessary. All medication should be taken as prescribed until the bottles are finished unless you are having an unusual reaction to one of the drugs.  Problems you should report to [place]:  a. Fever greater than 101F. b. Heavy bleeding, or clots (see notes above about blood in urine). c. Inability to urinate. d. Drug reactions (hives, rash, nausea, vomiting, diarrhea). e. Severe burning or pain with urination that is not improving.  Followup:  You will need a followup appointment to monitor your progress in most cases. Please call the office for this appointment when you get home if your appointment has not already been scheduled. Usually the first appointment will be about 5-14 days after your surgery and if you have a stent in place it will likely be removed at that time. ° °Post Anesthesia Home Care Instructions ° °Activity: °Get plenty of rest for the remainder of the day. A responsible adult should stay with you for 24  hours following the procedure.  °For the next 24 hours, DO NOT: °-Drive a car °-Operate machinery °-Drink alcoholic beverages °-Take any medication unless instructed by your physician °-Make any legal decisions or sign important papers. ° °Meals: °Start with liquid foods such as gelatin or soup. Progress to regular foods as tolerated. Avoid greasy, spicy, heavy foods. If nausea and/or vomiting occur, drink only clear liquids until the nausea and/or vomiting subsides. Call your physician if vomiting continues. ° °Special Instructions/Symptoms: °Your throat may feel dry or sore from the anesthesia or the breathing tube placed in your throat during surgery. If this causes discomfort, gargle with warm salt water. The discomfort should disappear within 24 hours. ° ° °

## 2013-10-16 NOTE — Anesthesia Postprocedure Evaluation (Signed)
  Anesthesia Post-op Note  Patient: Jeffery Hunter  Procedure(s) Performed: Procedure(s): CYSTOSCOPY WITH BILATERAL RETROGRADE PYELOGRAM, BILATERAL URETEROSCOPY AND bilateral STENT PLACEMENT, bilateral urethral dilation HOLMIUM LASER (Bilateral) HOLMIUM LASER APPLICATION (Bilateral)  Patient Location: PACU  Anesthesia Type:General  Level of Consciousness: awake, alert  and oriented  Airway and Oxygen Therapy: Patient Spontanous Breathing  Post-op Pain: mild  Post-op Assessment: Post-op Vital signs reviewed, Patient's Cardiovascular Status Stable, Respiratory Function Stable, Patent Airway, No signs of Nausea or vomiting and Pain level controlled  Post-op Vital Signs: Reviewed and stable  Last Vitals:  Filed Vitals:   10/16/13 2130  BP: 146/77  Pulse: 69  Temp:   Resp: 11    Complications: No apparent anesthesia complications

## 2013-10-18 NOTE — Progress Notes (Signed)
PACU note: Pt. Discharged to home prior to wasting fentanyl 50 mcg/1 ml. Witnessed by Unknown Foley rn

## 2013-10-19 ENCOUNTER — Encounter (HOSPITAL_COMMUNITY): Payer: Self-pay | Admitting: Urology

## 2016-11-12 ENCOUNTER — Encounter: Payer: Self-pay | Admitting: Interventional Cardiology

## 2016-11-13 ENCOUNTER — Other Ambulatory Visit: Payer: Self-pay | Admitting: Internal Medicine

## 2016-11-13 DIAGNOSIS — R9431 Abnormal electrocardiogram [ECG] [EKG]: Secondary | ICD-10-CM

## 2016-11-13 DIAGNOSIS — R000 Tachycardia, unspecified: Secondary | ICD-10-CM

## 2016-11-22 ENCOUNTER — Other Ambulatory Visit (HOSPITAL_COMMUNITY): Payer: Self-pay

## 2016-12-06 ENCOUNTER — Other Ambulatory Visit (HOSPITAL_COMMUNITY): Payer: Self-pay

## 2016-12-27 ENCOUNTER — Other Ambulatory Visit: Payer: Self-pay

## 2016-12-27 ENCOUNTER — Encounter (INDEPENDENT_AMBULATORY_CARE_PROVIDER_SITE_OTHER): Payer: Self-pay

## 2016-12-27 ENCOUNTER — Ambulatory Visit (HOSPITAL_COMMUNITY): Payer: BLUE CROSS/BLUE SHIELD | Attending: Cardiology

## 2016-12-27 DIAGNOSIS — R9431 Abnormal electrocardiogram [ECG] [EKG]: Secondary | ICD-10-CM

## 2016-12-27 DIAGNOSIS — R000 Tachycardia, unspecified: Secondary | ICD-10-CM

## 2016-12-27 DIAGNOSIS — I420 Dilated cardiomyopathy: Secondary | ICD-10-CM | POA: Insufficient documentation

## 2016-12-27 DIAGNOSIS — I5030 Unspecified diastolic (congestive) heart failure: Secondary | ICD-10-CM | POA: Insufficient documentation

## 2017-01-02 ENCOUNTER — Encounter: Payer: Self-pay | Admitting: *Deleted

## 2017-01-02 NOTE — Progress Notes (Addendum)
Cardiology Office Note   Date:  01/03/2017   ID:  Teren, Zurcher [birth year], MRN [id number]  PCP:  Crist Infante, MD    No chief complaint on file. abnormal echo   Wt Readings from Last 3 Encounters:  01/03/17 202 lb 9.6 oz (91.9 kg)  10/14/13 190 lb (86.2 kg)       History of Present Illness: Jeffery Hunter is a 61 y.o. male who is being seen today for the evaluation of abnormal echo at the request of Crist Infante, MD.   He had an echocardiogram showing: - Left ventricle: The cavity size was normal. Wall thickness was   increased in a pattern of mild LVH. The estimated ejection   fraction was 50%. Basal to mid inferior hypokinesis. Doppler   parameters are consistent with abnormal left ventricular   relaxation (grade 1 diastolic dysfunction).  He exercises for 1 hour 5-6 days/ weeks.   He had an episode of palpitations 6 months ago that resolved with carotid massage.  He had an ECG after this and there was a question of inferior Q waves which prompted echo.    Mild calcification of the coronary artery on prior CT.   Past Medical History:  Diagnosis Date  . Adenomatous polyp of colon   . BPH with urinary obstruction   . High frequency hearing loss   . Hyperlipemia   . Impacted cerumen of both ears   . Kidney stones   . Nephrolithiasis     Past Surgical History:  Procedure Laterality Date  . CYSTOSCOPY WITH RETROGRADE PYELOGRAM, URETEROSCOPY AND STENT PLACEMENT Bilateral 10/16/2013   Procedure: CYSTOSCOPY WITH BILATERAL RETROGRADE PYELOGRAM, BILATERAL URETEROSCOPY AND bilateral STENT PLACEMENT, bilateral urethral dilation HOLMIUM LASER;  Surgeon: Claybon Jabs, MD;  Location: [facility];  Service: Urology;  Laterality: Bilateral;  . HOLMIUM LASER APPLICATION Bilateral 2/70/3500   Procedure: HOLMIUM LASER APPLICATION;  Surgeon: Claybon Jabs, MD;  Location: [facility];  Service: Urology;  Laterality: Bilateral;     Current Outpatient Medications  Medication Sig  Dispense Refill  . meloxicam (MOBIC) 15 MG tablet Take 15 mg as needed by mouth for pain.     No current facility-administered medications for this visit.     Allergies:   Patient has no known allergies.    Social History:  The patient  reports that he has quit smoking. he has never used smokeless tobacco. He reports that he drinks about 4.2 oz of alcohol per week. He reports that he does not use drugs.   Family History:  The patient's family history includes AAA (abdominal aortic aneurysm) in his maternal grandfather; Alcohol abuse in his maternal grandmother, mother, and paternal grandfather; Alzheimer's disease in his mother; Breast cancer in his paternal grandmother; Colon polyps in his father; HIV in his brother; Healthy in his daughter, daughter, grandchild, and son; Heart Problems in his father and maternal grandfather; Hypertension in his mother; Prostate cancer in his father.  No early CAD.  Father did have bypass surgery in his 45s.   ROS:  Please see the history of present illness.   Otherwise, review of systems are positive for occasional leg edema at the end of the day.   All other systems are reviewed and negative.    PHYSICAL EXAM: VS:  BP (!) 152/96   Pulse 68   Ht 5\' 11"  (1.803 m)   Wt 202 lb 9.6 oz (91.9 kg)   SpO2 94%   BMI 28.26 kg/m  ,  BMI Body mass index is 28.26 kg/m. GEN: Well nourished, well developed, in no acute distress  HEENT: normal  Neck: no JVD, carotid bruits, or masses Cardiac: RRR; no murmurs, rubs, or gallops,no edema  Respiratory:  clear to auscultation bilaterally, normal work of breathing GI: soft, nontender, nondistended, + BS MS: no deformity or atrophy  Skin: warm and dry, no rash Neuro:  Strength and sensation are intact Psych: euthymic mood, full affect   EKG:   The ekg ordered today demonstrates sinus bradycardia, Q waves in lead III and aVF, no other ST segment changes   Recent Labs: No results found for requested labs within  last 8760 hours.   Lipid Panel No results found for: CHOL, TRIG, HDL, CHOLHDL, VLDL, LDLCALC, LDLDIRECT   Other studies Reviewed: Additional studies/ records that were reviewed today with results demonstrating: Prior ECG reviewed, LDL 120 in September 2018.   ASSESSMENT AND PLAN:  1. Abnormal echocardiogram: Question of inferior hypokinesis.  I personally reviewed the echocardiogram.  Ejection fraction appears to be at the lower end of the normal range.  There is certainly no evidence of akinesis, or thinning/scarring.  He reports that he was ill at the time of the echo.  He exercises at a high level getting his heart rate into the 150s and 160 range without any cardiac symptoms.  We discussed further evaluation with cardiac cath, but given his lack of symptoms, will hold off.  We also discussed stress testing.  What we decided on would be a repeat of his echocardiogram in about 6 months.  We will see if there is any change.  Certainly, if there is any change in his symptoms, would plan on further evaluation.  Continue aspirin for now. 2. Abnormal ECG: He has an old ECG from many years ago to compare.  We will see if there were any inferior Q waves present in the past. 3. Continue regular exercise. 4. Lipids adequately controlled for his level of risk factor. 5. Palpitations: One episode about 6 months ago.  Monitor would be low yield at this time.  He will let us know if he has any further symptoms.   Current medicines are reviewed at length with the patient today.  The patient concerns regarding his medicines were addressed.  The following changes have been made:  No change  Labs/ tests ordered today include:   Orders Placed This Encounter  Procedures  . ECHOCARDIOGRAM COMPLETE    Recommend 150 minutes/week of aerobic exercise Low fat, low carb, high fiber diet recommended  Disposition:   FU in 6 months   Signed, Larae Grooms, MD  01/03/2017 9:49 AM    [facility] [place], [place], [place]  [address] Phone: [phone]; Fax: [phone]

## 2017-01-03 ENCOUNTER — Encounter: Payer: Self-pay | Admitting: Interventional Cardiology

## 2017-01-03 ENCOUNTER — Ambulatory Visit: Payer: BLUE CROSS/BLUE SHIELD | Admitting: Interventional Cardiology

## 2017-01-03 VITALS — BP 152/96 | HR 68 | Ht 71.0 in | Wt 202.6 lb

## 2017-01-03 DIAGNOSIS — R931 Abnormal findings on diagnostic imaging of heart and coronary circulation: Secondary | ICD-10-CM

## 2017-01-03 DIAGNOSIS — R002 Palpitations: Secondary | ICD-10-CM

## 2017-01-03 DIAGNOSIS — R9431 Abnormal electrocardiogram [ECG] [EKG]: Secondary | ICD-10-CM

## 2017-01-03 NOTE — Patient Instructions (Signed)
Medication Instructions:  Your physician recommends that you continue on your current medications as directed. Please refer to the Current Medication list given to you today.   Labwork: None ordered  Testing/Procedures: Your physician has requested that you have an echocardiogram in 6 months. Echocardiography is a painless test that uses sound waves to create images of your heart. It provides your doctor with information about the size and shape of your heart and how well your heart's chambers and valves are working. This procedure takes approximately one hour. There are no restrictions for this procedure.    Follow-Up: Your physician wants you to follow-up in: 6 months with Dr. Irish Lack. You will receive a reminder letter in the mail two months in advance. If you don't receive a letter, please call our office to schedule the follow-up appointment.   Any Other Special Instructions Will Be Listed Below (If Applicable).     If you need a refill on your cardiac medications before your next appointment, please call your pharmacy.

## 2017-07-01 ENCOUNTER — Other Ambulatory Visit: Payer: Self-pay

## 2017-07-01 ENCOUNTER — Ambulatory Visit (HOSPITAL_COMMUNITY): Payer: BLUE CROSS/BLUE SHIELD | Attending: Internal Medicine

## 2017-07-01 DIAGNOSIS — R931 Abnormal findings on diagnostic imaging of heart and coronary circulation: Secondary | ICD-10-CM | POA: Insufficient documentation

## 2017-08-19 ENCOUNTER — Ambulatory Visit: Payer: BLUE CROSS/BLUE SHIELD | Admitting: Interventional Cardiology

## 2017-08-19 ENCOUNTER — Encounter

## 2017-10-23 ENCOUNTER — Encounter

## 2017-12-24 ENCOUNTER — Other Ambulatory Visit: Payer: Self-pay | Admitting: Internal Medicine

## 2017-12-24 DIAGNOSIS — E785 Hyperlipidemia, unspecified: Secondary | ICD-10-CM

## 2018-01-03 ENCOUNTER — Ambulatory Visit
Admission: RE | Admit: 2018-01-03 | Discharge: 2018-01-03 | Disposition: A | Discharge status: Home / Self Care | Payer: No Typology Code available for payment source | Source: Ambulatory Visit | Attending: Internal Medicine | Admitting: Internal Medicine

## 2018-01-03 DIAGNOSIS — E785 Hyperlipidemia, unspecified: Secondary | ICD-10-CM

## 2018-01-07 ENCOUNTER — Telehealth: Payer: Self-pay

## 2018-01-07 DIAGNOSIS — R931 Abnormal findings on diagnostic imaging of heart and coronary circulation: Secondary | ICD-10-CM

## 2018-01-07 NOTE — Telephone Encounter (Signed)
Patient returning call. Reviewed instructions for Cardiac CT below with the patient. Patient's resting HR is 45-58. No BB needed per Dr. Varanasi. Patient with recent BMET on 11/1 done with PCP showing BUN-17, Cr- 1.1, and eGFR- 67.4. Patient understands that he will be contacted regarding scheduling once pre-certed.   Please arrive at the [facility] main entrance of [facility] on ____ at _____ AM (30-45 minutes prior to test start time)  [facility] [address] [place], [place] [address] [phone]  Proceed to the [facility] (First Floor).  Please follow these instructions carefully (unless otherwise directed):    On the Night Before the Test: . Be sure to Drink plenty of water. . Do not consume any caffeinated/decaffeinated beverages or chocolate 12 hours prior to your test. . Do not take any antihistamines 12 hours prior to your test.   On the Day of the Test: . Drink plenty of water. Do not drink any water within one hour of the test. . Do not eat any food 4 hours prior to the test. . You may take your regular medications prior to the test.         After the Test: . Drink plenty of water. . After receiving IV contrast, you may experience a mild flushed feeling. This is normal. . On occasion, you may experience a mild rash up to 24 hours after the test. This is not dangerous. If this occurs, you can take Benadryl 25 mg and increase your fluid intake. . If you experience trouble breathing, this can be serious. If it is severe call 911 IMMEDIATELY. If it is mild, please call our office.  

## 2018-01-07 NOTE — Telephone Encounter (Signed)
Per Dr. Irish Lack, patient needs Cardiac CT. Order placed. Left message for patient to call back.

## 2018-02-13 ENCOUNTER — Telehealth (HOSPITAL_COMMUNITY): Payer: Self-pay | Admitting: Emergency Medicine

## 2018-02-13 ENCOUNTER — Telehealth: Payer: Self-pay | Admitting: Cardiovascular Disease

## 2018-02-13 NOTE — Telephone Encounter (Signed)
New message    Patient calling he has a respiratory infection ,should he have the Cardiac ct done tomorrow

## 2018-02-13 NOTE — Telephone Encounter (Signed)
Reaching out to patient to offer assistance regarding upcoming cardiac imaging study; pt verbalizes understanding of appt date/time, parking situation and where to check in, pre-test NPO status and medications ordered, and verified current allergies; name and call back number provided for further questions should they arise Marchia Bond RN Navigator Cardiac Imaging [phone] I explained to patient the he will need labs drawn the day of test as last labs noted are from august 2019

## 2018-02-13 NOTE — Telephone Encounter (Signed)
Spoke with patient who states he has a cough and possible URI and wants to make certain he can keep appointment for cardiac CT tomorrow. I advised him of the importance of slow HR and he verbalized understanding that he has received these instructions and was advised not to take Metoprolol prior to test due to a resting HR in the 40's bpm. He states he will avoid decongestants and will call back to reschedule if HR increases or if symptoms worsen. He thanked me for the call.

## 2018-02-14 ENCOUNTER — Ambulatory Visit (HOSPITAL_COMMUNITY)
Admission: RE | Admit: 2018-02-14 | Discharge: 2018-02-14 | Disposition: A | Discharge status: Home / Self Care | Payer: No Typology Code available for payment source | Source: Ambulatory Visit | Attending: Interventional Cardiology | Admitting: Interventional Cardiology

## 2018-02-14 DIAGNOSIS — R931 Abnormal findings on diagnostic imaging of heart and coronary circulation: Secondary | ICD-10-CM | POA: Diagnosis present

## 2018-02-14 LAB — POCT I-STAT CREATININE: Creatinine, Ser: 1.1 mg/dL (ref 0.61–1.24)

## 2018-02-14 MED ORDER — NITROGLYCERIN 0.4 MG SL SUBL
0.8000 mg | SUBLINGUAL_TABLET | Freq: Once | SUBLINGUAL | Status: AC
  Administered 2018-02-14: 0.8 mg via SUBLINGUAL
  Filled 2018-02-14: qty 25

## 2018-02-14 MED ORDER — IOPAMIDOL (ISOVUE-370) INJECTION 76%
100.0000 mL | Freq: Once | INTRAVENOUS | Status: AC | PRN
  Administered 2018-02-14: 100 mL via INTRAVENOUS

## 2018-02-14 MED ORDER — NITROGLYCERIN 0.4 MG SL SUBL
SUBLINGUAL_TABLET | SUBLINGUAL | Status: AC
  Filled 2018-02-14: qty 2

## 2018-02-14 MED ORDER — METOPROLOL TARTRATE 5 MG/5ML IV SOLN
5.0000 mg | INTRAVENOUS | Status: DC | PRN
  Filled 2018-02-14: qty 5

## 2018-02-20 ENCOUNTER — Telehealth: Payer: Self-pay | Admitting: Interventional Cardiology

## 2018-02-20 NOTE — Telephone Encounter (Signed)
Follow up    Spouse calling back regarding cath . She stated that he can be there by 2 tomorrow  for testing

## 2018-02-20 NOTE — Telephone Encounter (Signed)
error 

## 2018-02-20 NOTE — Telephone Encounter (Signed)
-----   Message from Jettie Booze, MD sent at 02/19/2018 11:02 PM EST ----- Please set up cath for Jan 14th at 7:30 AM.

## 2018-02-20 NOTE — Telephone Encounter (Signed)
Called and spoke to the patient's wife and made her aware that Dr. Irish Lack has spoken to the patient and he is going to come in on 02/26/18 at 4:20 PM for an OV and he will have his EKG and labs done at that time. Wife verbalized understanding and thanked me for the call.

## 2018-02-20 NOTE — Telephone Encounter (Signed)
Wife states that someone tried to call her earlier but lost connection. Made her aware that I had not called yet but I know that Dr. Irish Lack had been communicating with Dr. Mayer Camel regarding his Cardiac CT results and him needing a LHC.   Made her aware that we have arranged for his LHC to be done on 03/04/18 at 7:30 AM. She is aware that the patient will need to arrive to the hospital at 5:30 AM. Pre-cath instructions below reviewed with the wife who verbalized understanding. Made wife aware that the patient will need current labs as well as a current EKG on file prior to cath. Wife is going to check with the patient to see if he has had these done recently somewhere else and I will call her back later today for follow-up.    [facility] [place], [place] [address] Dept: [phone] Loc: [phone]  MAXIMILIEN HAYASHI  02/20/2018  You are scheduled for a Cardiac Catheterization on Tuesday, January 14 with Dr. Larae Grooms.  1. Please arrive at the [facility] (Main Entrance A) at [facility]: [address] [place], [place] [address] at 5:30 AM (This time is two hours before your procedure to ensure your preparation). Free valet parking service is available.   Special note: Every effort is made to have your procedure done on time. Please understand that emergencies sometimes delay scheduled procedures.  2. Diet: Do not eat solid foods after midnight.  The patient may have clear liquids until 5am upon the day of the procedure.  3. Labs: TBD  4. Medication instructions in preparation for your procedure:   Contrast Allergy: No  On the morning of your procedure, take your Aspirin and any morning medicines NOT listed above.  You may use sips of water.  5. Plan for one night stay--bring personal belongings. 6. Bring a current list of your medications and current insurance  cards. 7. You MUST have a responsible person to drive you home. 8. Someone MUST be with you the first 24 hours after you arrive home or your discharge will be delayed. 9. Please wear clothes that are easy to get on and off and wear slip-on shoes.  Thank you for allowing [place] to care for you!   -- [facility]

## 2018-02-25 ENCOUNTER — Encounter: Payer: Self-pay | Admitting: Interventional Cardiology

## 2018-02-26 ENCOUNTER — Encounter: Payer: Self-pay | Admitting: Interventional Cardiology

## 2018-02-26 ENCOUNTER — Ambulatory Visit: Payer: PRIVATE HEALTH INSURANCE | Admitting: Interventional Cardiology

## 2018-02-26 VITALS — BP 134/82 | HR 65 | Ht 71.0 in | Wt 203.0 lb

## 2018-02-26 DIAGNOSIS — I2510 Atherosclerotic heart disease of native coronary artery without angina pectoris: Secondary | ICD-10-CM | POA: Insufficient documentation

## 2018-02-26 DIAGNOSIS — Z01812 Encounter for preprocedural laboratory examination: Secondary | ICD-10-CM

## 2018-02-26 DIAGNOSIS — R931 Abnormal findings on diagnostic imaging of heart and coronary circulation: Secondary | ICD-10-CM | POA: Diagnosis not present

## 2018-02-26 DIAGNOSIS — I25118 Atherosclerotic heart disease of native coronary artery with other forms of angina pectoris: Secondary | ICD-10-CM | POA: Diagnosis not present

## 2018-02-26 MED ORDER — CLOPIDOGREL BISULFATE 75 MG PO TABS: 75.0000 mg | ORAL_TABLET | Freq: Every day | ORAL | 3 refills | Status: DC

## 2018-02-26 NOTE — Progress Notes (Signed)
Cardiology Office Note   Date:  02/26/2018   ID:  Jeffery Hunter, Jeffery Hunter 1955-10-26, MRN [id number]  PCP:  Crist Infante, MD    No chief complaint on file.  CAD  Wt Readings from Last 3 Encounters:  02/26/18 203 lb (92.1 kg)  01/03/17 202 lb 9.6 oz (91.9 kg)  10/14/13 190 lb (86.2 kg)       History of Present Illness: Jeffery Hunter is a 63 y.o. male who had a mildly abnormal echocardiogram in 2018.  He has also had symptoms of tachycardia that resolved with carotid massage in early 2018.  2018 echocardiogram revealed: Left ventricle: The cavity size was normal. Wall thickness was increased in a pattern of mild LVH. The estimated ejection fraction was 50%. Basal to mid inferior hypokinesis. Doppler parameters are consistent with abnormal left ventricular relaxation (grade 1 diastolic dysfunction).  We discussed further evaluation but given that he was asymptomatic with high-level exercise, we continued medical therapy.  He subsequently had a screening calcium scoring CT.  This showed an elevated calcium score.  We recommended statin at this point, but he did not start the medication until he had the CT angiogram.  Given the abnormal echocardiogram findings he had in the past, we followed this up with a CT angiogram of his coronary arteries.  This revealed significant disease in the LAD and diagonal, confirmed by FFR.  There is moderate disease in a ramus vessel which was not significant by FFR.  It appeared that he was left dominant.  He continues to exercise.  The intensity has dropped off somewhat.  Wife reports that he is fatigued.   Past Medical History:  Diagnosis Date  . Adenomatous polyp of colon   . BPH with urinary obstruction   . High frequency hearing loss   . Hyperlipemia   . Impacted cerumen of both ears   . Kidney stones   . Nephrolithiasis     Past Surgical History:  Procedure Laterality Date  . CYSTOSCOPY WITH RETROGRADE PYELOGRAM,  URETEROSCOPY AND STENT PLACEMENT Bilateral 10/16/2013   Procedure: CYSTOSCOPY WITH BILATERAL RETROGRADE PYELOGRAM, BILATERAL URETEROSCOPY AND bilateral STENT PLACEMENT, bilateral urethral dilation HOLMIUM LASER;  Surgeon: Claybon Jabs, MD;  Location: [facility];  Service: Urology;  Laterality: Bilateral;  . HOLMIUM LASER APPLICATION Bilateral 2/44/0102   Procedure: HOLMIUM LASER APPLICATION;  Surgeon: Claybon Jabs, MD;  Location: [facility];  Service: Urology;  Laterality: Bilateral;     Current Outpatient Medications  Medication Sig Dispense Refill  . aspirin EC 81 MG tablet Take 81 mg by mouth daily.    . rosuvastatin (CRESTOR) 20 MG tablet Take 20 mg by mouth daily.    . clopidogrel (PLAVIX) 75 MG tablet Take 1 tablet (75 mg total) by mouth daily. 90 tablet 3   No current facility-administered medications for this visit.     Allergies:   Patient has no known allergies.    Social History:  The patient  reports that he has quit smoking. He has never used smokeless tobacco. He reports current alcohol use of about 7.0 standard drinks of alcohol per week. He reports that he does not use drugs.   Family History:  The patient's family history includes AAA (abdominal aortic aneurysm) in his maternal grandfather; Alcohol abuse in his maternal grandmother, mother, and paternal grandfather; Alzheimer's disease in his mother; Breast cancer in his paternal grandmother; Colon polyps in his father; HIV in his brother; Healthy in his daughter, daughter, grandchild, and  son; Heart Problems in his father and maternal grandfather; Hypertension in his mother; Prostate cancer in his father.    ROS:  Please see the history of present illness.   Otherwise, review of systems are positive for more fatigue of late.   All other systems are reviewed and negative.    PHYSICAL EXAM: VS:  BP 134/82   Pulse 65   Ht 5\' 11"  (1.803 m)   Wt 203 lb (92.1 kg)   SpO2 95%   BMI 28.31 kg/m  , BMI Body mass index is 28.31  kg/m. GEN: Well nourished, well developed, in no acute distress  HEENT: normal  Neck: no JVD, carotid bruits, or masses Cardiac: RRR, skipped beats; no murmurs, rubs, or gallops,no edema  Respiratory:  clear to auscultation bilaterally, normal work of breathing GI: soft, nontender, nondistended, + BS MS: no deformity or atrophy ; 2+ right radial pulse Skin: warm and dry, no rash Neuro:  Strength and sensation are intact Psych: euthymic mood, full affect   EKG:   The ekg ordered today demonstrates NSR, IRBBB, PVC   Recent Labs: 02/14/2018: Creatinine, Ser 1.10   Lipid Panel No results found for: CHOL, TRIG, HDL, CHOLHDL, VLDL, LDLCALC, LDLDIRECT   Other studies Reviewed: Additional studies/ records that were reviewed today with results demonstrating: Cardiac CT flow map reviewed.   ASSESSMENT AND PLAN:  1. TDD:UKGURK LAD disease by cardiac CT.  Heavy calcification.  We went over the risks and benefits of cardiac catheterization and PCI.  All questions were answered.  We discussed the potential use of atherectomy as well due to the heavy calcification.  We will start clopidogrel 75 mg daily so that he will be preloaded.  Continue aspirin and statin. 2. We discussed longterm management of CAD with statin, and rationale of CABG vs. PCI.   Cardiac catheterization was discussed with the patient fully. The patient understands that risks include but are not limited to stroke (1 in 1000), death (1 in 91), kidney failure [usually temporary] (1 in 500), bleeding (1 in 200), allergic reaction [possibly serious] (1 in 200).  The patient understands and is willing to proceed.      Current medicines are reviewed at length with the patient today.  The patient concerns regarding his medicines were addressed.  The following changes have been made: Add clopidogrel 75 mg daily.  Labs/ tests ordered today include:   Orders Placed This Encounter  Procedures  . Basic metabolic panel  . CBC    . EKG 12-Lead    Recommend 150 minutes/week of aerobic exercise Low fat, low carb, high fiber diet recommended  Disposition:   FU for cath   Signed, Larae Grooms, MD  02/26/2018 5:19 PM    [facility] [place], [place], [place]  [address] Phone: [phone]; Fax: [phone]

## 2018-02-26 NOTE — Patient Instructions (Addendum)
Medication Instructions:  Your physician has recommended you make the following change in your medication:   START: clopidogrel (plavix) 75 mg once a day   If you need a refill on your cardiac medications before your next appointment, please call your pharmacy.   Lab work: TOMORROW: CBC, BMET  If you have labs (blood work) drawn today and your tests are completely normal, you will receive your results only by: Marland Kitchen MyChart Message (if you have MyChart) OR . A paper copy in the mail If you have any lab test that is abnormal or we need to change your treatment, we will call you to review the results.  Testing/Procedures: Your physician has requested that you have a cardiac catheterization on 03/04/18. Cardiac catheterization is used to diagnose and/or treat various heart conditions. Doctors may recommend this procedure for a number of different reasons. The most common reason is to evaluate chest pain. Chest pain can be a symptom of coronary artery disease (CAD), and cardiac catheterization can show whether plaque is narrowing or blocking your heart's arteries. This procedure is also used to evaluate the valves, as well as measure the blood flow and oxygen levels in different parts of your heart. For further information please visit [facility]. Please follow instruction sheet, as given.  Follow-Up: . Based on cath results  Any Other Special Instructions Will Be Listed Below (If Applicable).  [facility] [place], [place] [address] Dept: [phone] Loc: [phone]  EGAN BERKHEIMER                        02/20/2018  You are scheduled for a Cardiac Catheterization on Tuesday, January 14 with Dr. Larae Grooms.  1. Please arrive at the [facility] (Main Entrance A) at [facility]: [address] [place], [place] [address] at 5:30 AM (This time is two hours before  your procedure to ensure your preparation). Free valet parking service is available.   Special note: Every effort is made to have your procedure done on time. Please understand that emergencies sometimes delay scheduled procedures.  2. Diet: Do not eat solid foods after midnight.  The patient may have clear liquids until 5am upon the day of the procedure.  3. Labs: 02/26/18: CBC, BMET  4. Medication instructions in preparation for your procedure:   Contrast Allergy: No  On the morning of your procedure, take your Aspirin AND clopidogrel (plavix) and any morning medicines NOT listed above.  You may use sips of water.  5. Plan for one night stay--bring personal belongings. 6. Bring a current list of your medications and current insurance cards. 7. You MUST have a responsible person to drive you home. 8. Someone MUST be with you the first 24 hours after you arrive home or your discharge will be delayed. 9. Please wear clothes that are easy to get on and off and wear slip-on shoes.  Thank you for allowing [place] to care for you!   -- [facility]

## 2018-02-26 NOTE — H&P (View-Only) (Signed)
Cardiology Office Note   Date:  02/26/2018   ID:  Jeffery Hunter, Jeffery Hunter 12-10-1955, MRN [id number]  PCP:  Crist Infante, MD    No chief complaint on file.  CAD  Wt Readings from Last 3 Encounters:  02/26/18 203 lb (92.1 kg)  01/03/17 202 lb 9.6 oz (91.9 kg)  10/14/13 190 lb (86.2 kg)       History of Present Illness: Jeffery Hunter is a 63 y.o. male who had a mildly abnormal echocardiogram in 2018.  He has also had symptoms of tachycardia that resolved with carotid massage in early 2018.  2018 echocardiogram revealed: Left ventricle: The cavity size was normal. Wall thickness was increased in a pattern of mild LVH. The estimated ejection fraction was 50%. Basal to mid inferior hypokinesis. Doppler parameters are consistent with abnormal left ventricular relaxation (grade 1 diastolic dysfunction).  We discussed further evaluation but given that he was asymptomatic with high-level exercise, we continued medical therapy.  He subsequently had a screening calcium scoring CT.  This showed an elevated calcium score.  We recommended statin at this point, but he did not start the medication until he had the CT angiogram.  Given the abnormal echocardiogram findings he had in the past, we followed this up with a CT angiogram of his coronary arteries.  This revealed significant disease in the LAD and diagonal, confirmed by FFR.  There is moderate disease in a ramus vessel which was not significant by FFR.  It appeared that he was left dominant.  He continues to exercise.  The intensity has dropped off somewhat.  Wife reports that he is fatigued.   Past Medical History:  Diagnosis Date  . Adenomatous polyp of colon   . BPH with urinary obstruction   . High frequency hearing loss   . Hyperlipemia   . Impacted cerumen of both ears   . Kidney stones   . Nephrolithiasis     Past Surgical History:  Procedure Laterality Date  . CYSTOSCOPY WITH RETROGRADE PYELOGRAM,  URETEROSCOPY AND STENT PLACEMENT Bilateral 10/16/2013   Procedure: CYSTOSCOPY WITH BILATERAL RETROGRADE PYELOGRAM, BILATERAL URETEROSCOPY AND bilateral STENT PLACEMENT, bilateral urethral dilation HOLMIUM LASER;  Surgeon: Claybon Jabs, MD;  Location: [facility];  Service: Urology;  Laterality: Bilateral;  . HOLMIUM LASER APPLICATION Bilateral 5/95/6387   Procedure: HOLMIUM LASER APPLICATION;  Surgeon: Claybon Jabs, MD;  Location: [facility];  Service: Urology;  Laterality: Bilateral;     Current Outpatient Medications  Medication Sig Dispense Refill  . aspirin EC 81 MG tablet Take 81 mg by mouth daily.    . rosuvastatin (CRESTOR) 20 MG tablet Take 20 mg by mouth daily.    . clopidogrel (PLAVIX) 75 MG tablet Take 1 tablet (75 mg total) by mouth daily. 90 tablet 3   No current facility-administered medications for this visit.     Allergies:   Patient has no known allergies.    Social History:  The patient  reports that he has quit smoking. He has never used smokeless tobacco. He reports current alcohol use of about 7.0 standard drinks of alcohol per week. He reports that he does not use drugs.   Family History:  The patient's family history includes AAA (abdominal aortic aneurysm) in his maternal grandfather; Alcohol abuse in his maternal grandmother, mother, and paternal grandfather; Alzheimer's disease in his mother; Breast cancer in his paternal grandmother; Colon polyps in his father; HIV in his brother; Healthy in his daughter, daughter, grandchild, and  son; Heart Problems in his father and maternal grandfather; Hypertension in his mother; Prostate cancer in his father.    ROS:  Please see the history of present illness.   Otherwise, review of systems are positive for more fatigue of late.   All other systems are reviewed and negative.    PHYSICAL EXAM: VS:  BP 134/82   Pulse 65   Ht 5\' 11"  (1.803 m)   Wt 203 lb (92.1 kg)   SpO2 95%   BMI 28.31 kg/m  , BMI Body mass index is 28.31  kg/m. GEN: Well nourished, well developed, in no acute distress  HEENT: normal  Neck: no JVD, carotid bruits, or masses Cardiac: RRR, skipped beats; no murmurs, rubs, or gallops,no edema  Respiratory:  clear to auscultation bilaterally, normal work of breathing GI: soft, nontender, nondistended, + BS MS: no deformity or atrophy ; 2+ right radial pulse Skin: warm and dry, no rash Neuro:  Strength and sensation are intact Psych: euthymic mood, full affect   EKG:   The ekg ordered today demonstrates NSR, IRBBB, PVC   Recent Labs: 02/14/2018: Creatinine, Ser 1.10   Lipid Panel No results found for: CHOL, TRIG, HDL, CHOLHDL, VLDL, LDLCALC, LDLDIRECT   Other studies Reviewed: Additional studies/ records that were reviewed today with results demonstrating: Cardiac CT flow map reviewed.   ASSESSMENT AND PLAN:  1. ZHY:[id number] LAD disease by cardiac CT.  Heavy calcification.  We went over the risks and benefits of cardiac catheterization and PCI.  All questions were answered.  We discussed the potential use of atherectomy as well due to the heavy calcification.  We will start clopidogrel 75 mg daily so that he will be preloaded.  Continue aspirin and statin. 2. We discussed longterm management of CAD with statin, and rationale of CABG vs. PCI.   Cardiac catheterization was discussed with the patient fully. The patient understands that risks include but are not limited to stroke (1 in 1000), death (1 in 70), kidney failure [usually temporary] (1 in 500), bleeding (1 in 200), allergic reaction [possibly serious] (1 in 200).  The patient understands and is willing to proceed.      Current medicines are reviewed at length with the patient today.  The patient concerns regarding his medicines were addressed.  The following changes have been made: Add clopidogrel 75 mg daily.  Labs/ tests ordered today include:   Orders Placed This Encounter  Procedures  . Basic metabolic panel  . CBC    . EKG 12-Lead    Recommend 150 minutes/week of aerobic exercise Low fat, low carb, high fiber diet recommended  Disposition:   FU for cath   Signed, Larae Grooms, MD  02/26/2018 5:19 PM    [facility] [place], [place], [place]  [address] Phone: [phone]; Fax: [phone]

## 2018-02-27 ENCOUNTER — Other Ambulatory Visit: Payer: PRIVATE HEALTH INSURANCE | Admitting: *Deleted

## 2018-02-27 DIAGNOSIS — I25118 Atherosclerotic heart disease of native coronary artery with other forms of angina pectoris: Secondary | ICD-10-CM

## 2018-02-27 DIAGNOSIS — Z01812 Encounter for preprocedural laboratory examination: Secondary | ICD-10-CM

## 2018-02-27 DIAGNOSIS — R931 Abnormal findings on diagnostic imaging of heart and coronary circulation: Secondary | ICD-10-CM

## 2018-02-28 LAB — CBC
Hematocrit: 44.3 % (ref 37.5–51.0)
Hemoglobin: 15.6 g/dL (ref 13.0–17.7)
MCH: 29.7 pg (ref 26.6–33.0)
MCHC: 35.2 g/dL (ref 31.5–35.7)
MCV: 84 fL (ref 79–97)
Platelets: 199 10*3/uL (ref 150–450)
RBC: 5.25 x10E6/uL (ref 4.14–5.80)
RDW: 12.8 % (ref 11.6–15.4)
WBC: 6.4 10*3/uL (ref 3.4–10.8)

## 2018-02-28 LAB — BASIC METABOLIC PANEL
BUN/Creatinine Ratio: 17 (ref 10–24)
BUN: 21 mg/dL (ref 8–27)
CO2: 24 mmol/L (ref 20–29)
Calcium: 9.2 mg/dL (ref 8.6–10.2)
Chloride: 103 mmol/L (ref 96–106)
Creatinine, Ser: 1.23 mg/dL (ref 0.76–1.27)
GFR calc Af Amer: 71 mL/min/{1.73_m2} (ref >59)
GFR calc non Af Amer: 62 mL/min/{1.73_m2} (ref >59)
Glucose: 76 mg/dL (ref 65–99)
Potassium: 3.7 mmol/L (ref 3.5–5.2)
Sodium: 142 mmol/L (ref 134–144)

## 2018-03-03 ENCOUNTER — Telehealth: Payer: Self-pay | Admitting: *Deleted

## 2018-03-03 NOTE — Telephone Encounter (Addendum)
Pt contacted pre-catheterization scheduled at [facility] for: Tuesday March 04, 2018 7:30 AM Verified arrival time and place: [place] Entrance A at: 5:30 AM  No solid food after midnight prior to cath, clear liquids until 5 AM day of procedure.   AM meds can be  taken pre-cath with sip of water including: ASA 81 mg Clopidogrel 75 mg  Confirm patient has responsible person to drive home post procedure and for 24 hours after you arrive home.  I reviewed instructions with patient's wife, she verbalized understanding, thanked me for call.

## 2018-03-04 ENCOUNTER — Other Ambulatory Visit (HOSPITAL_COMMUNITY): Payer: PRIVATE HEALTH INSURANCE

## 2018-03-04 ENCOUNTER — Encounter (HOSPITAL_COMMUNITY)
Admission: RE | Disposition: A | Discharge status: Home / Self Care | Payer: Self-pay | Source: Home / Self Care | Attending: Interventional Cardiology

## 2018-03-04 ENCOUNTER — Other Ambulatory Visit: Payer: Self-pay

## 2018-03-04 ENCOUNTER — Ambulatory Visit (HOSPITAL_COMMUNITY)
Admission: RE | Admit: 2018-03-04 | Discharge: 2018-03-04 | Disposition: A | Discharge status: Home / Self Care | Payer: No Typology Code available for payment source | Attending: Interventional Cardiology | Admitting: Interventional Cardiology

## 2018-03-04 ENCOUNTER — Encounter (HOSPITAL_COMMUNITY): Payer: Self-pay | Admitting: Interventional Cardiology

## 2018-03-04 DIAGNOSIS — I2584 Coronary atherosclerosis due to calcified coronary lesion: Secondary | ICD-10-CM | POA: Diagnosis not present

## 2018-03-04 DIAGNOSIS — H903 Sensorineural hearing loss, bilateral: Secondary | ICD-10-CM | POA: Diagnosis not present

## 2018-03-04 DIAGNOSIS — Z79899 Other long term (current) drug therapy: Secondary | ICD-10-CM | POA: Insufficient documentation

## 2018-03-04 DIAGNOSIS — E785 Hyperlipidemia, unspecified: Secondary | ICD-10-CM | POA: Diagnosis not present

## 2018-03-04 DIAGNOSIS — I25118 Atherosclerotic heart disease of native coronary artery with other forms of angina pectoris: Secondary | ICD-10-CM | POA: Diagnosis not present

## 2018-03-04 DIAGNOSIS — Z8249 Family history of ischemic heart disease and other diseases of the circulatory system: Secondary | ICD-10-CM | POA: Insufficient documentation

## 2018-03-04 DIAGNOSIS — Z7982 Long term (current) use of aspirin: Secondary | ICD-10-CM | POA: Insufficient documentation

## 2018-03-04 DIAGNOSIS — Z87891 Personal history of nicotine dependence: Secondary | ICD-10-CM | POA: Diagnosis not present

## 2018-03-04 DIAGNOSIS — Z7902 Long term (current) use of antithrombotics/antiplatelets: Secondary | ICD-10-CM | POA: Diagnosis not present

## 2018-03-04 HISTORY — PX: LEFT HEART CATH AND CORONARY ANGIOGRAPHY: CATH118249

## 2018-03-04 SURGERY — LEFT HEART CATH AND CORONARY ANGIOGRAPHY
Anesthesia: LOCAL

## 2018-03-04 MED ORDER — ASPIRIN 81 MG PO CHEW: 81.0000 mg | CHEWABLE_TABLET | ORAL | Status: AC

## 2018-03-04 MED ORDER — VERAPAMIL HCL 2.5 MG/ML IV SOLN
INTRAVENOUS | Status: DC | PRN
  Administered 2018-03-04: 10 mL via INTRA_ARTERIAL

## 2018-03-04 MED ORDER — FENTANYL CITRATE (PF) 100 MCG/2ML IJ SOLN
INTRAMUSCULAR | Status: AC
  Filled 2018-03-04: qty 2

## 2018-03-04 MED ORDER — HEPARIN SODIUM (PORCINE) 1000 UNIT/ML IJ SOLN
INTRAMUSCULAR | Status: DC | PRN
  Administered 2018-03-04: 4500 [IU] via INTRAVENOUS

## 2018-03-04 MED ORDER — SODIUM CHLORIDE 0.9% FLUSH: 3.0000 mL | INTRAVENOUS | Status: DC | PRN

## 2018-03-04 MED ORDER — SODIUM CHLORIDE 0.9 % IV SOLN: 250.0000 mL | INTRAVENOUS | Status: DC | PRN

## 2018-03-04 MED ORDER — HEPARIN SODIUM (PORCINE) 1000 UNIT/ML IJ SOLN
INTRAMUSCULAR | Status: AC
  Filled 2018-03-04: qty 1

## 2018-03-04 MED ORDER — FENTANYL CITRATE (PF) 100 MCG/2ML IJ SOLN
INTRAMUSCULAR | Status: DC | PRN
  Administered 2018-03-04 (×2): 25 ug via INTRAVENOUS

## 2018-03-04 MED ORDER — MIDAZOLAM HCL 2 MG/2ML IJ SOLN
INTRAMUSCULAR | Status: AC
  Filled 2018-03-04: qty 2

## 2018-03-04 MED ORDER — MIDAZOLAM HCL 2 MG/2ML IJ SOLN
INTRAMUSCULAR | Status: DC | PRN
  Administered 2018-03-04: 1 mg via INTRAVENOUS
  Administered 2018-03-04: 2 mg via INTRAVENOUS

## 2018-03-04 MED ORDER — LIDOCAINE HCL (PF) 1 % IJ SOLN
INTRAMUSCULAR | Status: AC
  Filled 2018-03-04: qty 30

## 2018-03-04 MED ORDER — HEPARIN (PORCINE) IN NACL 1000-0.9 UT/500ML-% IV SOLN
INTRAVENOUS | Status: DC | PRN
  Administered 2018-03-04 (×2): 500 mL

## 2018-03-04 MED ORDER — LIDOCAINE HCL (PF) 1 % IJ SOLN
INTRAMUSCULAR | Status: DC | PRN
  Administered 2018-03-04: 2 mL

## 2018-03-04 MED ORDER — SODIUM CHLORIDE 0.9 % WEIGHT BASED INFUSION: 1.0000 mL/kg/h | INTRAVENOUS | Status: DC

## 2018-03-04 MED ORDER — IOHEXOL 350 MG/ML SOLN
INTRAVENOUS | Status: DC | PRN
  Administered 2018-03-04: 75 mL via INTRA_ARTERIAL

## 2018-03-04 MED ORDER — SODIUM CHLORIDE 0.9% FLUSH: 3.0000 mL | Freq: Two times a day (BID) | INTRAVENOUS | Status: DC

## 2018-03-04 MED ORDER — CLOPIDOGREL BISULFATE 75 MG PO TABS: 75.0000 mg | ORAL_TABLET | ORAL | Status: AC

## 2018-03-04 MED ORDER — VERAPAMIL HCL 2.5 MG/ML IV SOLN
INTRAVENOUS | Status: AC
  Filled 2018-03-04: qty 2

## 2018-03-04 MED ORDER — HEPARIN (PORCINE) IN NACL 1000-0.9 UT/500ML-% IV SOLN
INTRAVENOUS | Status: AC
  Filled 2018-03-04: qty 1000

## 2018-03-04 MED ORDER — SODIUM CHLORIDE 0.9 % WEIGHT BASED INFUSION
3.0000 mL/kg/h | INTRAVENOUS | Status: AC
  Administered 2018-03-04: 3 mL/kg/h via INTRAVENOUS

## 2018-03-04 SURGICAL SUPPLY — 10 items
CATH 5FR JL3.5 JR4 ANG PIG MP (CATHETERS) ×1 IMPLANT
DEVICE RAD COMP TR BAND LRG (VASCULAR PRODUCTS) ×1 IMPLANT
GLIDESHEATH SLEND SS 6F .021 (SHEATH) ×1 IMPLANT
GUIDEWIRE INQWIRE 1.5J.035X260 (WIRE) IMPLANT
INQWIRE 1.5J .035X260CM (WIRE) ×2
KIT HEART LEFT (KITS) ×2 IMPLANT
PACK CARDIAC CATHETERIZATION (CUSTOM PROCEDURE TRAY) ×2 IMPLANT
SHEATH PROBE COVER 6X72 (BAG) ×1 IMPLANT
TRANSDUCER W/STOPCOCK (MISCELLANEOUS) ×2 IMPLANT
TUBING CIL FLEX 10 FLL-RA (TUBING) ×2 IMPLANT

## 2018-03-04 NOTE — Progress Notes (Signed)
[facility] was called for outpt CABG consult. The office will call the patient with an appointment.

## 2018-03-04 NOTE — Interval H&P Note (Signed)
Cath Lab Visit (complete for each Cath Lab visit)  Clinical Evaluation Leading to the Procedure:   ACS: No.  Non-ACS:    Anginal Classification: CCS III  Anti-ischemic medical therapy: Minimal Therapy (1 class of medications)  Non-Invasive Test Results: High-risk stress test findings: cardiac mortality >3%/year  Prior CABG: No previous CABG  Multivessel disease.  Mildly decreased LV function.  Fatigue.    History and Physical Interval Note:  03/04/2018 7:30 AM  Jeffery Hunter  has presented today for surgery, with the diagnosis of Abnormal CT  The various methods of treatment have been discussed with the patient and family. After consideration of risks, benefits and other options for treatment, the patient has consented to  Procedure(s): LEFT HEART CATH AND CORONARY ANGIOGRAPHY (N/A) as a surgical intervention .  The patient's history has been reviewed, patient examined, no change in status, stable for surgery.  I have reviewed the patient's chart and labs.  Questions were answered to the patient's satisfaction.     Larae Grooms

## 2018-03-04 NOTE — Discharge Instructions (Signed)
Radial Site Care ° °This sheet gives you information about how to care for yourself after your procedure. Your health care provider may also give you more specific instructions. If you have problems or questions, contact your health care provider. °What can I expect after the procedure? °After the procedure, it is common to have: °· Bruising and tenderness at the catheter insertion area. °Follow these instructions at home: °Medicines °· Take over-the-counter and prescription medicines only as told by your health care provider. °Insertion site care °· Follow instructions from your health care provider about how to take care of your insertion site. Make sure you: °? Wash your hands with soap and water before you change your bandage (dressing). If soap and water are not available, use hand sanitizer. °? Change your dressing as told by your health care provider. °? Leave stitches (sutures), skin glue, or adhesive strips in place. These skin closures may need to stay in place for 2 weeks or longer. If adhesive strip edges start to loosen and curl up, you may trim the loose edges. Do not remove adhesive strips completely unless your health care provider tells you to do that. °· Check your insertion site every day for signs of infection. Check for: °? Redness, swelling, or pain. °? Fluid or blood. °? Pus or a bad smell. °? Warmth. °· Do not take baths, swim, or use a hot tub until your health care provider approves. °· You may shower 24-48 hours after the procedure, or as directed by your health care provider. °? Remove the dressing and gently wash the site with plain soap and water. °? Pat the area dry with a clean towel. °? Do not rub the site. That could cause bleeding. °· Do not apply powder or lotion to the site. °Activity ° °· For 24 hours after the procedure, or as directed by your health care provider: °? Do not flex or bend the affected arm. °? Do not push or pull heavy objects with the affected arm. °? Do not  drive yourself home from the hospital or clinic. You may drive 24 hours after the procedure unless your health care provider tells you not to. °? Do not operate machinery or power tools. °· Do not lift anything that is heavier than 10 lb (4.5 kg), or the limit that you are told, until your health care provider says that it is safe. °· Ask your health care provider when it is okay to: °? Return to work or school. °? Resume usual physical activities or sports. °? Resume sexual activity. °General instructions °· If the catheter site starts to bleed, raise your arm and put firm pressure on the site. If the bleeding does not stop, get help right away. This is a medical emergency. °· If you went home on the same day as your procedure, a responsible adult should be with you for the first 24 hours after you arrive home. °· Keep all follow-up visits as told by your health care provider. This is important. °Contact a health care provider if: °· You have a fever. °· You have redness, swelling, or yellow drainage around your insertion site. °Get help right away if: °· You have unusual pain at the radial site. °· The catheter insertion area swells very fast. °· The insertion area is bleeding, and the bleeding does not stop when you hold steady pressure on the area. °· Your arm or hand becomes pale, cool, tingly, or numb. °These symptoms may represent a serious problem   that is an emergency. Do not wait to see if the symptoms will go away. Get medical help right away. Call your local emergency services (911 in the [place] Do not drive yourself to the hospital. °Summary °· After the procedure, it is common to have bruising and tenderness at the site. °· Follow instructions from your health care provider about how to take care of your radial site wound. Check the wound every day for signs of infection. °· Do not lift anything that is heavier than 10 lb (4.5 kg), or the limit that you are told, until your health care provider says  that it is safe. °This information is not intended to replace advice given to you by your health care provider. Make sure you discuss any questions you have with your health care provider. °Document Released: 03/10/2010 Document Revised: 03/13/2017 Document Reviewed: 03/13/2017 °Elsevier Interactive Patient Education © 2019 [employer] ° °

## 2018-03-07 ENCOUNTER — Institutional Professional Consult (permissible substitution): Payer: PRIVATE HEALTH INSURANCE | Admitting: Cardiothoracic Surgery

## 2018-03-07 ENCOUNTER — Other Ambulatory Visit: Payer: Self-pay | Admitting: *Deleted

## 2018-03-07 VITALS — BP 157/93 | HR 72 | Resp 20 | Ht 71.0 in | Wt 198.0 lb

## 2018-03-07 DIAGNOSIS — I2510 Atherosclerotic heart disease of native coronary artery without angina pectoris: Secondary | ICD-10-CM

## 2018-03-07 NOTE — Progress Notes (Signed)
[facility] [address]       Stokes,Chilhowie Hunter             [phone]                    Jeffery Hunter [facility] Medical Record #[id number] Date of Birth: Nov 15, 1955  Referring: Jettie Booze, MD Primary Care: Crist Infante, MD Primary Cardiologist: Larae Grooms, MD  Chief Complaint:    Chief Complaint  Patient presents with  . Coronary Artery Disease    Surgical eval, Cardiac Cath 03/04/2018, ECHO 07/01/2017, CT Coronary 02/14/18    History of Present Illness:    Jeffery Hunter 63 y.o. male is seen in the office  today for consideration of coronary artery bypass grafting.  The patient is a practicing orthopedic surgeon notes that he exercises on a daily basis without anginal symptoms.  Does note some fatigue in the evenings after working all day, but then goes to the gym  For a hour workout getting his heart rate to 130 without symptoms.  The patient has a positive family history of coronary artery disease including his father at age 93 having coronary artery bypass grafting grandfather died at 105 of a myocardial infarction.  4 to 5 years ago he had a questionable history of SVT but this had never recurred.  Several years ago he was noted on a routine annual EKG to have very small Q waves.  More recently he had a calcium score, noted to be 1100.  This led to cardiac CT suggesting coronary artery disease possibly high-grade stenosis in the right coronary artery was started on Plavix and underwent cardiac catheterization by Dr. Irish Lack several days ago.  The time of cardiac catheterization his right coronary artery was noted to be totally occluded with collateral flow from the LAD with 75% stenosis of the LAD near the takeoff of the septal there was a 90% stenosis of the small intermediate the circumflex was a very large vessel without significant disease.  FFR calculations were done from the cardiac CT, suggesting hemodynamic significance of the LAD  lesion.    Current Activity/ Functional Status:  Patient is independent with mobility/ambulation, transfers, ADL's, IADL's.   Zubrod Score: At the time of surgery this patient's most appropriate activity status/level should be described as: [x]     0    Normal activity, no symptoms []     1    Restricted in physical strenuous activity but ambulatory, able to do out light work []     2    Ambulatory and capable of self care, unable to do work activities, up and about               >50 % of waking hours                              []     3    Only limited self care, in bed greater than 50% of waking hours []     4    Completely disabled, no self care, confined to bed or chair []     5    Moribund   Past Medical History:  Diagnosis Date  . Adenomatous polyp of colon   . BPH with urinary obstruction   . High frequency hearing loss   . Hyperlipemia   . Impacted cerumen of both ears   . Kidney stones   .  Nephrolithiasis     Past Surgical History:  Procedure Laterality Date  . CYSTOSCOPY WITH RETROGRADE PYELOGRAM, URETEROSCOPY AND STENT PLACEMENT Bilateral 10/16/2013   Procedure: CYSTOSCOPY WITH BILATERAL RETROGRADE PYELOGRAM, BILATERAL URETEROSCOPY AND bilateral STENT PLACEMENT, bilateral urethral dilation HOLMIUM LASER;  Surgeon: Claybon Jabs, MD;  Location: [facility];  Service: Urology;  Laterality: Bilateral;  . HOLMIUM LASER APPLICATION Bilateral 10/22/4095   Procedure: HOLMIUM LASER APPLICATION;  Surgeon: Claybon Jabs, MD;  Location: [facility];  Service: Urology;  Laterality: Bilateral;  . LEFT HEART CATH AND CORONARY ANGIOGRAPHY N/A 03/04/2018   Procedure: LEFT HEART CATH AND CORONARY ANGIOGRAPHY;  Surgeon: Jettie Booze, MD;  Location: [facility];  Service: Cardiovascular;  Laterality: N/A;    Family History  Problem Relation Age of Onset  . Hypertension Mother   . Alcohol abuse Mother   . Alzheimer's disease Mother   . Prostate cancer Father   . Colon polyps  Father   . Heart Problems Father        CABG @76    . Alcohol abuse Maternal Grandmother   . Heart Problems Maternal Grandfather   . AAA (abdominal aortic aneurysm) Maternal Grandfather   . Breast cancer Paternal Grandmother        meta.  . Alcohol abuse Paternal Grandfather   . HIV Brother   . Healthy Son   . Healthy Daughter   . Healthy Daughter   . Healthy Grandchild      Social History   Tobacco Use  Smoking Status Former Smoker  Smokeless Tobacco Never Used    Social History   Substance and Sexual Activity  Alcohol Use Yes  . Alcohol/week: 7.0 standard drinks  . Types: 7 Cans of beer per week     No Known Allergies  Current Outpatient Medications  Medication Sig Dispense Refill  . aspirin EC 81 MG tablet Take 81 mg by mouth daily.    . benzonatate (TESSALON) 200 MG capsule Take 200 mg by mouth 2 (two) times daily as needed.     Marland Kitchen FLOVENT HFA 110 MCG/ACT inhaler Inhale 2 puffs into the lungs as needed.     . rosuvastatin (CRESTOR) 20 MG tablet Take 20 mg by mouth daily.    . clopidogrel (PLAVIX) 75 MG tablet Take 1 tablet (75 mg total) by mouth daily. 90 tablet 3   No current facility-administered medications for this visit.     Pertinent items are noted in HPI.   Review of Systems:     Cardiac Review of Systems: [Y] = yes  or   [ N ] = no   Chest Pain [ n   ]  Resting SOB [n   ] Exertional SOB  [n  ]  Orthopnea Florencio.Farrier  ]   Pedal Edema [ n  ]    Palpitations [n  ] Syncope  Florencio.Farrier ]   Presyncope [ n  ]   General Review of Systems: [Y] = yes [  ]=no Constitional: recent weight change [  ];  Wt loss over the last 3 months [   ] anorexia [  ]; fatigue [  ]; nausea [  ]; night sweats [  ]; fever [  ]; or chills [  ];           Eye : blurred vision [  ]; diplopia [   ]; vision changes [  ];  Amaurosis fugax[  ]; Resp: cough [  ];  wheezing[  ];  hemoptysis[  ]; shortness of breath[  ]; paroxysmal nocturnal dyspnea[  ]; dyspnea on exertion[  ]; or orthopnea[  ];  GI:   gallstones[  ], vomiting[  ];  dysphagia[  ]; melena[  ];  hematochezia [  ]; heartburn[  ];   Hx of  Colonoscopy[  ]; GU: kidney stones [  ]; hematuria[  ];   dysuria [  ];  nocturia[  ];  history of     obstruction [  ]; urinary frequency [  ]             Skin: rash, swelling[  ];, hair loss[  ];  peripheral edema[  ];  or itching[  ]; Musculosketetal: myalgias[  ];  joint swelling[  ];  joint erythema[  ];  joint pain[  ];  back pain[  ];  Heme/Lymph: bruising[  ];  bleeding[  ];  anemia[  ];  Neuro: TIA[  ];  headaches[  ];  stroke[  ];  vertigo[  ];  seizures[  ];   paresthesias[  ];  difficulty walking[  ];  Psych:depression[  ]; anxiety[  ];  Endocrine: diabetes[  ];  thyroid dysfunction[  ];  Immunizations: Flu up to date Blue.Reese  ]; Pneumococcal up to date [ y ];  Other:     PHYSICAL EXAMINATION: BP (!) 157/93   Pulse 72   Resp 20   Ht 5\' 11"  (1.803 m)   Wt 198 lb (89.8 kg)   SpO2 95% Comment: RA  BMI 27.62 kg/m  General appearance: alert, cooperative, appears stated age and no distress Head: Normocephalic, without obvious abnormality, atraumatic Neck: no adenopathy, no carotid bruit, no JVD, supple, symmetrical, trachea midline and thyroid not enlarged, symmetric, no tenderness/mass/nodules Lymph nodes: Cervical, supraclavicular, and axillary nodes normal. Resp: clear to auscultation bilaterally Cardio: regular rate and rhythm, S1, S2 normal, no murmur, click, rub or gallop GI: soft, non-tender; bowel sounds normal; no masses,  no organomegaly Extremities: extremities normal, atraumatic, no cyanosis or edema and Homans sign is negative, no sign of DVT Neurologic: Grossly normal palpable DP and PT pulses bilaterally, appears to have adequate vein and lower extremities for bypass, some bruising left forearm from IV site, less bruising at the right radial cath site  Diagnostic Studies & Laboratory data:     Recent Radiology Findings:   Ct Coronary Morph W/cta Cor W/score W/ca  W/cm &/or Wo/cm  Addendum Date: 02/14/2018   ADDENDUM REPORT: 02/14/2018 19:38 HISTORY: abnormal echo/elevated Ca score EXAM: Cardiac/Coronary  CT TECHNIQUE: The patient was scanned on a [employer]. PROTOCOL: A 120 kV prospective scan was triggered in the descending thoracic aorta at 111 HU's. Axial non-contrast 3 mm slices were carried out through the heart. The data set was analyzed on a dedicated work station and scored using the [place]. Gantry rotation speed was 250 msecs and collimation was .6 mm. No beta blockade and 0.8 mg of sl NTG was given. The 3D data set was reconstructed in 5% intervals of the 67-82 % of the R-R cycle. Diastolic phases were analyzed on a dedicated work station using MPR, MIP and VRT modes. The patient received 80 cc of contrast. FINDINGS: Coronary calcium score: The patient's coronary artery calcium score is 877, which places the patient in the 92nd percentile. This is greater than expected for age and gender matched peers. Coronary arteries: Normal coronary origins.  Right dominance. Right Coronary Artery: The ostial RCA is patent. The proximal and mid  RCA are non-diagnostic due to motion artifact. Distal RCA is small caliber and patent. Patent PDA, PL. Left Main Coronary Artery: 6.5 mm caliber, short left main. Minimal distal mixed atherosclerotic plaque, <25% stenosis. Left Anterior Descending Coronary Artery: Mild atherosclerotic plaque in proximal LAD 25-49% stenosis. Moderate mixed atherosclerotic plaque in the mid LAD, 50-69% stenosis. Two, possible severe calcified plaques in the mid LAD, one with circumferential calcification, 70-99% stenosis. The LAD then takes a brief intramyocardial course, with superficial myocardial bridging. Distal LAD is small caliber and patent. Ramus intermedius: Proximal likely severe stenosis (motion artifact limits exact assessment), 70-99% stenosis. Left Circumflex Artery: Mild atherosclerotic plaque in the proximal  circumflex. Moderate narrowing at the ostium of OM2. Aorta:  Normal size.  No calcifications.  No dissection. Aortic Valve: No calcifications. Other findings: Normal pulmonary vein drainage into the left atrium. Normal left atrial appendage without a thrombus. Normal size of the pulmonary artery. LVEDD 56mm. Left atrial enlargement. IMPRESSION: 1. The patient's coronary artery calcium score is 877, which places the patient in the 92nd percentile. This is greater than expected for age and gender matched peers. 2. Normal coronary origin with right dominance. 3. Severe CAD, CADRADS = 4. Possible severe stenosis in the mid LAD, proximal ramus intermedius. Nondiagnostic images of the RCA due to motion artifact. CT FFR analysis will be sent. Electronically Signed   By: Cherlynn Kaiser   On: 02/14/2018 19:38   Result Date: 02/14/2018 EXAM: OVER-READ INTERPRETATION  CT CHEST The following report is an over-read performed by radiologist Dr. Rolm Baptise of [facility] on 02/14/2018. This over-read does not include interpretation of cardiac or coronary anatomy or pathology. The coronary CTA interpretation by the cardiologist is attached. COMPARISON:  01/03/2018 FINDINGS: Vascular: Mild cardiomegaly.  Aorta is normal caliber. Mediastinum/Nodes: No adenopathy in the lower mediastinum or hila. Lungs/Pleura: No confluent opacities or effusions. Upper Abdomen: Imaging into the upper abdomen shows no acute findings. Musculoskeletal: Chest wall soft tissues are unremarkable. No acute bony abnormality. IMPRESSION: No acute extra cardiac abnormality. Mild cardiomegaly. Electronically Signed: By: Rolm Baptise M.D. On: 02/14/2018 15:32   Ct Coronary Fractional Flow Reserve Fluid Analysis  Result Date: 02/16/2018 EXAM: CT FFR ANALYSIS CLINICAL DATA:  abnormal echo/elevated Ca score FINDINGS: FFRct analysis was performed on the original cardiac CT angiogram dataset. Diagrammatic representation of the FFRct analysis is  provided in a separate PDF document in PACS. This dictation was created using the PDF document and an interactive 3D model of the results. 3D model is not available in the EMR/PACS. Normal FFR range is >0.80. 1. Left Main:  No significant stenosis. FFR = 0.99 2. LAD: No significant stenosis. Proximal FFR = 0.89, Mid FFR = <0.50, Diagonal that branches at the mid LAD = <0.50, distal FFR = <0.50 3. LCX: No significant stenosis. Proximal FFR = 0.90, Distal FFR = not mapped, Proximal OM = 0.84, distal OM = 0.75 4. RCA: Could not be mapped due to motion artifact. 5. Ramus intermedius vs. High diagonal: Proximal FFR = 0.9, Distal FFR = 0.86 IMPRESSION: 1. CT FFR analysis shows severe stenosis in the mid LAD and the diagonal branch that arises adjacent to the mid LAD (likely D3). This appears to be a potential bifurcation lesion. Electronically Signed   By: Cherlynn Kaiser   On: 02/16/2018 18:35     I have independently reviewed the above radiology studies  and reviewed the findings with the patient.   Recent Lab Findings: Lab Results  Component  Value Date   WBC 6.4 02/27/2018   HGB 15.6 02/27/2018   HCT 44.3 02/27/2018   PLT 199 02/27/2018   GLUCOSE 76 02/27/2018   ALT 18 10/14/2013   AST 27 10/14/2013   NA 142 02/27/2018   K 3.7 02/27/2018   CL 103 02/27/2018   CREATININE 1.23 02/27/2018   BUN 21 02/27/2018   CO2 24 02/27/2018    CATH:   Prox RCA lesion is 100% stenosed.  Ramus-1 lesion is 80% stenosed.  Ramus-2 lesion is 90% stenosed.  Ost 1st Diag lesion is 75% stenosed.  Prox LAD to Mid LAD lesion is 75% stenosed.  The left ventricular systolic function is normal.  LV end diastolic pressure is normal.  The left ventricular ejection fraction is 50-55% by visual estimate.  There is no aortic valve stenosis.   Severe multivessel, calcific CAD.   Plan for cardiac surgery consult as an outpatient.   Diagnostic  Dominance: Right  Left Anterior Descending  Prox LAD lesion  40% stenosed  Prox LAD lesion is 40% stenosed. The lesion is calcified.  Prox LAD to Mid LAD lesion 75% stenosed  Prox LAD to Mid LAD lesion is 75% stenosed. The lesion is moderately calcified.  First Diagonal Branch  Ost 1st Diag lesion 75% stenosed  Ost 1st Diag lesion is 75% stenosed.  Ramus Intermedius  Ramus-1 lesion 80% stenosed  Ramus-1 lesion is 80% stenosed.  Ramus-2 lesion 90% stenosed  Ramus-2 lesion is 90% stenosed.  Left Circumflex  The vessel exhibits minimal luminal irregularities.  Right Coronary Artery  Prox RCA lesion 100% stenosed  Prox RCA lesion is 100% stenosed.  Right Posterior Descending Artery  Collaterals  RPDA filled by collaterals from 2nd Sept.     AO Systolic Pressure 008 mmHg  AO Diastolic Pressure 81 mmHg  AO Mean 676 mmHg  LV Systolic Pressure 195 mmHg  LV Diastolic Pressure 3 mmHg  LV EDP 6 mmHg  AOp Systolic Pressure 093 mmHg  AOp Diastolic Pressure 80 mmHg  AOp Mean Pressure 267 mmHg  LVp Systolic Pressure 124 mmHg  LVp Diastolic Pressure 2 mmHg  LVp EDP Pressure 5 mmHg        I have independently reviewed the above  cath films and reviewed the findings with the  patient .   Assessment / Plan:   #1 patient with total occlusion of the right coronary artery with collaterals from the LAD, and significant proximal and mid LAD disease, positive CT FFR in the LAD distribution. -After extensive review of the patient's cath films and CT and discussion with his cardiologist Dr. Irish Lack, I have recommended to the patient that we proceed with coronary artery bypass grafting, particularly with the goal of placement of left internal mammary artery to the left anterior descending coronary artery.  Patient is agreeable with proceeding with surgery, options of angioplasty and stenting LAD versus medical treatment have also been reviewed with him.  He is agreeable with proceeding. Hold his Plavix and will consider proceeding with coronary artery bypass  grafting Wednesday, January 22  The goals risks and alternatives of the planned surgical procedure CABG  have been discussed with the patient in detail. The risks of the procedure including death, infection, stroke, myocardial infarction, bleeding, blood transfusion, artial fib  have all been discussed specifically.  I have quoted Kerin Salen a 1.5  % of perioperative mortality and a complication rate as high as 30 %. The patient's questions have been answered.Jeffery Hunter is willing  to proceed with the planned procedure.  I spent 60 minutes face-to-face with patient reviewing his history and exam and discussion of treatment options and risks and expectations of coronary artery bypass grafting.  Grace Isaac MD      [address] [place] [address] Office [phone]   Beeper [phone]  03/09/2018 6:49 PM

## 2018-03-10 ENCOUNTER — Encounter (HOSPITAL_COMMUNITY): Payer: Self-pay

## 2018-03-10 ENCOUNTER — Ambulatory Visit (HOSPITAL_COMMUNITY)
Admission: RE | Admit: 2018-03-10 | Discharge: 2018-03-10 | Disposition: A | Discharge status: Home / Self Care | Payer: No Typology Code available for payment source | Source: Ambulatory Visit | Attending: Cardiothoracic Surgery | Admitting: Cardiothoracic Surgery

## 2018-03-10 ENCOUNTER — Encounter (HOSPITAL_COMMUNITY)
Admission: RE | Admit: 2018-03-10 | Discharge: 2018-03-10 | Disposition: A | Discharge status: Home / Self Care | Payer: No Typology Code available for payment source | Source: Ambulatory Visit | Attending: Cardiothoracic Surgery | Admitting: Cardiothoracic Surgery

## 2018-03-10 ENCOUNTER — Ambulatory Visit (HOSPITAL_BASED_OUTPATIENT_CLINIC_OR_DEPARTMENT_OTHER)
Admission: RE | Admit: 2018-03-10 | Discharge: 2018-03-10 | Disposition: A | Discharge status: Home / Self Care | Payer: No Typology Code available for payment source | Source: Ambulatory Visit | Attending: Cardiothoracic Surgery | Admitting: Cardiothoracic Surgery

## 2018-03-10 ENCOUNTER — Other Ambulatory Visit: Payer: Self-pay

## 2018-03-10 DIAGNOSIS — I2510 Atherosclerotic heart disease of native coronary artery without angina pectoris: Secondary | ICD-10-CM

## 2018-03-10 HISTORY — DX: Atherosclerotic heart disease of native coronary artery without angina pectoris: I25.10

## 2018-03-10 HISTORY — DX: Personal history of urinary calculi: Z87.442

## 2018-03-10 HISTORY — DX: Other specified postprocedural states: Z98.890

## 2018-03-10 HISTORY — DX: Respiratory tuberculosis unspecified: A15.9

## 2018-03-10 LAB — BLOOD GAS, ARTERIAL
Acid-base deficit: 0.6 mmol/L (ref 0.0–2.0)
Bicarbonate: 23.2 mmol/L (ref 20.0–28.0)
Drawn by: 54525
FIO2: 21
O2 Saturation: 95.9 %
Patient temperature: 98.6
pCO2 arterial: 35.5 mmHg (ref 32.0–48.0)
pH, Arterial: 7.431 (ref 7.350–7.450)
pO2, Arterial: 79.9 mmHg — ABNORMAL LOW (ref 83.0–108.0)

## 2018-03-10 LAB — PULMONARY FUNCTION TEST
FEF 25-75 Post: 2.17 L/sec
FEF 25-75 Pre: 2.02 L/sec
FEF2575-%Change-Post: 7 %
FEF2575-%Pred-Post: 75 %
FEF2575-%Pred-Pre: 70 %
FEV1-%Change-Post: 1 %
FEV1-%Pred-Post: 75 %
FEV1-%Pred-Pre: 74 %
FEV1-Post: 2.71 L
FEV1-Pre: 2.66 L
FEV1FVC-%Change-Post: 2 %
FEV1FVC-%Pred-Pre: 100 %
FEV6-%Change-Post: -1 %
FEV6-%Pred-Post: 75 %
FEV6-%Pred-Pre: 76 %
FEV6-Post: 3.44 L
FEV6-Pre: 3.5 L
FEV6FVC-%Change-Post: -1 %
FEV6FVC-%Pred-Post: 102 %
FEV6FVC-%Pred-Pre: 103 %
FVC-%Change-Post: 0 %
FVC-%Pred-Post: 73 %
FVC-%Pred-Pre: 73 %
FVC-Post: 3.53 L
FVC-Pre: 3.55 L
Post FEV1/FVC ratio: 77 %
Post FEV6/FVC ratio: 98 %
Pre FEV1/FVC ratio: 75 %
Pre FEV6/FVC Ratio: 99 %

## 2018-03-10 LAB — COMPREHENSIVE METABOLIC PANEL
ALT: 31 U/L (ref 0–44)
AST: 31 U/L (ref 15–41)
Albumin: 3.8 g/dL (ref 3.5–5.0)
Alkaline Phosphatase: 76 U/L (ref 38–126)
Anion gap: 11 (ref 5–15)
BUN: 11 mg/dL (ref 8–23)
CO2: 25 mmol/L (ref 22–32)
Calcium: 8.8 mg/dL — ABNORMAL LOW (ref 8.9–10.3)
Chloride: 102 mmol/L (ref 98–111)
Creatinine, Ser: 1.2 mg/dL (ref 0.61–1.24)
GFR calc Af Amer: >60 mL/min (ref >60)
GFR calc non Af Amer: >60 mL/min (ref >60)
Glucose, Bld: 122 mg/dL — ABNORMAL HIGH (ref 70–99)
Potassium: 3.3 mmol/L — ABNORMAL LOW (ref 3.5–5.1)
Sodium: 138 mmol/L (ref 135–145)
Total Bilirubin: 0.8 mg/dL (ref 0.3–1.2)
Total Protein: 7.1 g/dL (ref 6.5–8.1)

## 2018-03-10 LAB — SURGICAL PCR SCREEN
MRSA, PCR: NEGATIVE
Staphylococcus aureus: POSITIVE — AB

## 2018-03-10 LAB — URINALYSIS, ROUTINE W REFLEX MICROSCOPIC
Bacteria, UA: NONE SEEN
Bilirubin Urine: NEGATIVE
Glucose, UA: NEGATIVE mg/dL
Ketones, ur: NEGATIVE mg/dL
Leukocytes, UA: NEGATIVE
Nitrite: NEGATIVE
Protein, ur: NEGATIVE mg/dL
Specific Gravity, Urine: 1.004 — ABNORMAL LOW (ref 1.005–1.030)
pH: 5 (ref 5.0–8.0)

## 2018-03-10 LAB — ABO/RH: ABO/RH(D): O POS

## 2018-03-10 LAB — PROTIME-INR
INR: 1.04
Prothrombin Time: 13.5 seconds (ref 11.4–15.2)

## 2018-03-10 LAB — CBC
HCT: 45.9 % (ref 39.0–52.0)
Hemoglobin: 15.2 g/dL (ref 13.0–17.0)
MCH: 28.4 pg (ref 26.0–34.0)
MCHC: 33.1 g/dL (ref 30.0–36.0)
MCV: 85.6 fL (ref 80.0–100.0)
Platelets: 207 10*3/uL (ref 150–400)
RBC: 5.36 MIL/uL (ref 4.22–5.81)
RDW: 11.7 % (ref 11.5–15.5)
WBC: 7.1 10*3/uL (ref 4.0–10.5)
nRBC: 0 % (ref 0.0–0.2)

## 2018-03-10 LAB — TYPE AND SCREEN
ABO/RH(D): O POS
Antibody Screen: NEGATIVE

## 2018-03-10 LAB — HEMOGLOBIN A1C
Hgb A1c MFr Bld: 5.8 % — ABNORMAL HIGH (ref 4.8–5.6)
Mean Plasma Glucose: 119.76 mg/dL

## 2018-03-10 LAB — APTT: aPTT: 32 seconds (ref 24–36)

## 2018-03-10 MED ORDER — ALBUTEROL SULFATE (2.5 MG/3ML) 0.083% IN NEBU
2.5000 mg | INHALATION_SOLUTION | Freq: Once | RESPIRATORY_TRACT | Status: AC
  Administered 2018-03-10: 2.5 mg via RESPIRATORY_TRACT

## 2018-03-10 NOTE — Progress Notes (Signed)
Mupirocin Ointment Rx called into [facility] on [address] for positive PCR of Staph. Pt notified and voiced understanding.

## 2018-03-10 NOTE — Progress Notes (Signed)
PCP -  Dr. Crist Infante Cardiologist - Dr. Ulyses Amor  Chest x-ray - 03/10/2018 EKG - 02/26/2018 Stress Test - denies ECHO - 07/01/2017 Cardiac Cath - 03/04/2018  Sleep Study - denies CPAP - N/A  Blood Thinner Instructions: Plavix - LD 03/06/2018 Aspirin Instructions: LD 03/11/2018  Anesthesia review: Yes, CAD  Patient denies shortness of breath, fever, and chest pain at PAT appointment  Patient verbalized understanding of instructions that were given to them at the PAT appointment. Patient was also instructed that they will need to review over the PAT instructions again at home before surgery.

## 2018-03-10 NOTE — Pre-Procedure Instructions (Signed)
Jeffery Hunter  03/10/2018    [facility] [address] [place] [address] [place] [place] [address] Phone: [phone] Fax: [phone]   Your procedure is scheduled on Wednesday, March 12, 2018  Report to [facility] at 5:30 A.M.  Call this number if you have problems the morning of surgery:  [phone]   Remember:  Do not eat or drink after midnight.     Take these medicines the morning of surgery with A SIP OF WATER   benzonatate (TESSALON) - as needed FLOVENT HFA 110 MCG/ACT inhaler -as needed - bring with you the day of surgery rosuvastatin (CRESTOR) 20 MG tablet  As of today, STOP taking any Aspirin (unless otherwise instructed by your surgeon), Aleve, Naproxen, Ibuprofen, Motrin, Advil, Goody's, BC's, all herbal medications, fish oil, and all vitamins.  Follow your surgeon's instructions on when to stop Aspirin.  If no instructions were given by your surgeon then you will need to call the office to get those instructions.     Do not wear jewelry, make-up or nail polish.  Do not wear lotions, powders, or cologne, or deodorant.  Do not shave 48 hours prior to surgery.  Men may shave face and neck.  Do not bring valuables to the hospital.  [facility] is not responsible for any belongings or valuables.  Contacts, dentures or bridgework may not be worn into surgery.  Leave your suitcase in the car.  After surgery it may be brought to your room.  For patients admitted to the hospital, discharge time will be determined by your treatment team.  Patients discharged the day of surgery will not be allowed to drive home.   Special instructions:   [facility]- Preparing For Surgery  Before surgery, you can play an important role. Because skin is not sterile, your skin needs to be as free of germs as possible. You can reduce the number of germs on your skin by washing with CHG (chlorahexidine gluconate) Soap before  surgery.  CHG is an antiseptic cleaner which kills germs and bonds with the skin to continue killing germs even after washing.    Oral Hygiene is also important to reduce your risk of infection.  Remember - BRUSH YOUR TEETH THE MORNING OF SURGERY WITH YOUR REGULAR TOOTHPASTE  Please do not use if you have an allergy to CHG or antibacterial soaps. If your skin becomes reddened/irritated stop using the CHG.  Do not shave (including legs and underarms) for at least 48 hours prior to first CHG shower. It is OK to shave your face.  Please follow these instructions carefully.   1. Shower the NIGHT BEFORE SURGERY and the MORNING OF SURGERY with CHG.   2. If you chose to wash your hair, wash your hair first as usual with your normal shampoo.  3. After you shampoo, rinse your hair and body thoroughly to remove the shampoo.  4. Use CHG as you would any other liquid soap. You can apply CHG directly to the skin and wash gently with a scrungie or a clean washcloth.   5. Apply the CHG Soap to your body ONLY FROM THE NECK DOWN.  Do not use on open wounds or open sores. Avoid contact with your eyes, ears, mouth and genitals (private parts). Wash Face and genitals (private parts)  with your normal soap.  6. Wash thoroughly, paying special attention to the area where your surgery will be performed.  7. Thoroughly rinse your  body with warm water from the neck down.  8. DO NOT shower/wash with your normal soap after using and rinsing off the CHG Soap.  9. Pat yourself dry with a CLEAN TOWEL.  10. Wear CLEAN PAJAMAS to bed the night before surgery, wear comfortable clothes the morning of surgery  11. Place CLEAN SHEETS on your bed the night of your first shower and DO NOT SLEEP WITH PETS.  Day of Surgery:  Do not apply any deodorants/lotions.  Please wear clean clothes to the hospital/surgery center.   Remember to brush your teeth WITH YOUR REGULAR TOOTHPASTE.  Please read over the following fact  sheets that you were given. Pain Booklet, Coughing and Deep Breathing and Surgical Site Infection Prevention

## 2018-03-10 NOTE — Progress Notes (Signed)
Pre-CABG testing has been completed. Preliminary results can be found in CV Proc through chart review.   03/10/18 4:31 PM Jeffery Hunter RVT

## 2018-03-11 ENCOUNTER — Encounter (HOSPITAL_COMMUNITY): Payer: Self-pay | Admitting: Anesthesiology

## 2018-03-11 MED ORDER — TRANEXAMIC ACID (OHS) BOLUS VIA INFUSION
15.0000 mg/kg | INTRAVENOUS | Status: AC
  Administered 2018-03-12: 1345.5 mg via INTRAVENOUS
  Filled 2018-03-11: qty 1346

## 2018-03-11 MED ORDER — INSULIN REGULAR(HUMAN) IN NACL 100-0.9 UT/100ML-% IV SOLN
INTRAVENOUS | Status: AC
  Administered 2018-03-12: 1 [IU]/h via INTRAVENOUS
  Filled 2018-03-11: qty 100

## 2018-03-11 MED ORDER — DEXMEDETOMIDINE HCL IN NACL 400 MCG/100ML IV SOLN
0.1000 ug/kg/h | INTRAVENOUS | Status: AC
  Administered 2018-03-12: .4 ug/kg/h via INTRAVENOUS
  Filled 2018-03-11: qty 100

## 2018-03-11 MED ORDER — SODIUM CHLORIDE 0.9 % IV SOLN
750.0000 mg | INTRAVENOUS | Status: DC
  Filled 2018-03-11: qty 750

## 2018-03-11 MED ORDER — POTASSIUM CHLORIDE 2 MEQ/ML IV SOLN
80.0000 meq | INTRAVENOUS | Status: DC
  Filled 2018-03-11: qty 40

## 2018-03-11 MED ORDER — SODIUM CHLORIDE 0.9 % IV SOLN
INTRAVENOUS | Status: DC
  Filled 2018-03-11: qty 30

## 2018-03-11 MED ORDER — PLASMA-LYTE 148 IV SOLN
INTRAVENOUS | Status: AC
  Administered 2018-03-12: 500 mL
  Filled 2018-03-11: qty 2.5

## 2018-03-11 MED ORDER — TRANEXAMIC ACID (OHS) PUMP PRIME SOLUTION
2.0000 mg/kg | INTRAVENOUS | Status: DC
  Filled 2018-03-11: qty 1.79

## 2018-03-11 MED ORDER — TRANEXAMIC ACID 1000 MG/10ML IV SOLN
1.5000 mg/kg/h | INTRAVENOUS | Status: AC
  Administered 2018-03-12: 1.5 mg/kg/h via INTRAVENOUS
  Filled 2018-03-11: qty 25

## 2018-03-11 MED ORDER — EPINEPHRINE PF 1 MG/ML IJ SOLN
0.0000 ug/min | INTRAVENOUS | Status: DC
  Filled 2018-03-11: qty 4

## 2018-03-11 MED ORDER — MILRINONE LACTATE IN DEXTROSE 20-5 MG/100ML-% IV SOLN
0.3000 ug/kg/min | INTRAVENOUS | Status: DC
  Filled 2018-03-11: qty 100

## 2018-03-11 MED ORDER — DOPAMINE-DEXTROSE 3.2-5 MG/ML-% IV SOLN
0.0000 ug/kg/min | INTRAVENOUS | Status: DC
  Filled 2018-03-11: qty 250

## 2018-03-11 MED ORDER — NITROGLYCERIN IN D5W 200-5 MCG/ML-% IV SOLN
2.0000 ug/min | INTRAVENOUS | Status: AC
  Administered 2018-03-12: 16.6 ug/min via INTRAVENOUS
  Filled 2018-03-11: qty 250

## 2018-03-11 MED ORDER — VANCOMYCIN HCL 10 G IV SOLR
1500.0000 mg | INTRAVENOUS | Status: AC
  Administered 2018-03-12: 1500 mg via INTRAVENOUS
  Filled 2018-03-11: qty 1500

## 2018-03-11 MED ORDER — SODIUM CHLORIDE 0.9 % IV SOLN
1.5000 g | INTRAVENOUS | Status: AC
  Administered 2018-03-12: 1.5 g via INTRAVENOUS
  Filled 2018-03-11: qty 1.5

## 2018-03-11 MED ORDER — PHENYLEPHRINE HCL-NACL 20-0.9 MG/250ML-% IV SOLN
30.0000 ug/min | INTRAVENOUS | Status: AC
  Administered 2018-03-12: 40 ug/min via INTRAVENOUS
  Filled 2018-03-11: qty 250

## 2018-03-11 MED ORDER — NOREPINEPHRINE-SODIUM CHLORIDE 4-0.9 MG/250ML-% IV SOLN
0.0000 ug/min | INTRAVENOUS | Status: DC
  Filled 2018-03-11: qty 250

## 2018-03-11 MED ORDER — MAGNESIUM SULFATE 50 % IJ SOLN
40.0000 meq | INTRAMUSCULAR | Status: DC
  Filled 2018-03-11: qty 9.85

## 2018-03-11 NOTE — Anesthesia Preprocedure Evaluation (Addendum)
Anesthesia Evaluation  Patient identified by MRN, date of birth, ID band Patient awake    Reviewed: Allergy & Precautions, NPO status , Patient's Chart, lab work & pertinent test results  Airway Mallampati: I  TM Distance: >3 FB Neck ROM: Full    Dental  (+) Teeth Intact, Dental Advisory Given   Pulmonary former smoker,    Pulmonary exam normal        Cardiovascular + CAD   Rhythm:Regular Rate:Normal     Neuro/Psych negative neurological ROS  negative psych ROS   GI/Hepatic negative GI ROS, Neg liver ROS,   Endo/Other  negative endocrine ROS  Renal/GU      Musculoskeletal   Abdominal Normal abdominal exam  (+)   Peds  Hematology negative hematology ROS (+)   Anesthesia Other Findings - HLD  - Episode of Afib after PA Cath insertion, spontaneous resolution.  Reproductive/Obstetrics                           Lab Results  Component Value Date   WBC 7.1 03/10/2018   HGB 15.2 03/10/2018   HCT 45.9 03/10/2018   MCV 85.6 03/10/2018   PLT 207 03/10/2018   Lab Results  Component Value Date   CREATININE 1.20 03/10/2018   BUN 11 03/10/2018   NA 138 03/10/2018   K 3.3 (L) 03/10/2018   CL 102 03/10/2018   CO2 25 03/10/2018   Lab Results  Component Value Date   INR 1.04 03/10/2018   Echo: - Left ventricle: The cavity size was normal. There was mild focal   basal hypertrophy of the septum. Systolic function was normal.   The estimated ejection fraction was in the range of 50% to 55%.   Wall motion was normal; there were no regional wall motion   abnormalities. Left ventricular diastolic function parameters   were normal. - Aortic valve: Trileaflet; normal thickness, mildly calcified   leaflets. - Aorta: Aortic root dimension: 39 mm (ED). Ascending aortic   diameter: 39 mm (S). - Aortic root: The aortic root was mildly dilated. - Ascending aorta: The ascending aorta was mildly  dilated. - Mitral valve: There was trivial regurgitation. - Left atrium: The atrium was mildly dilated. - Pulmonic valve: There was trivial regurgitation.  Anesthesia Physical Anesthesia Plan  ASA: IV  Anesthesia Plan: General   Post-op Pain Management:    Induction: Intravenous  PONV Risk Score and Plan: Treatment may vary due to age or medical condition  Airway Management Planned: Oral ETT  Additional Equipment: Arterial line, CVP, PA Cath, TEE and Ultrasound Guidance Line Placement  Intra-op Plan:   Post-operative Plan: Post-operative intubation/ventilation  Informed Consent:   Plan Discussed with: CRNA, Anesthesiologist and Surgeon  Anesthesia Plan Comments:        Anesthesia Quick Evaluation

## 2018-03-12 ENCOUNTER — Encounter (HOSPITAL_COMMUNITY): Payer: Self-pay | Admitting: *Deleted

## 2018-03-12 ENCOUNTER — Inpatient Hospital Stay (HOSPITAL_COMMUNITY): Payer: No Typology Code available for payment source | Admitting: Certified Registered"

## 2018-03-12 ENCOUNTER — Inpatient Hospital Stay (HOSPITAL_COMMUNITY)
Admission: RE | Admit: 2018-03-12 | Discharge: 2018-03-16 | DRG: 236 | Disposition: A | Discharge status: Home / Self Care | Payer: No Typology Code available for payment source | Attending: Cardiothoracic Surgery | Admitting: Cardiothoracic Surgery

## 2018-03-12 ENCOUNTER — Inpatient Hospital Stay (HOSPITAL_COMMUNITY): Payer: No Typology Code available for payment source

## 2018-03-12 ENCOUNTER — Inpatient Hospital Stay (HOSPITAL_COMMUNITY)
Admission: RE | Disposition: A | Discharge status: Home / Self Care | Payer: Self-pay | Source: Home / Self Care | Attending: Cardiothoracic Surgery

## 2018-03-12 ENCOUNTER — Inpatient Hospital Stay (HOSPITAL_COMMUNITY)
Admission: RE | Admit: 2018-03-12 | Discharge: 2018-03-12 | Disposition: A | Discharge status: Home / Self Care | Payer: No Typology Code available for payment source | Source: Home / Self Care | Attending: Cardiothoracic Surgery | Admitting: Cardiothoracic Surgery

## 2018-03-12 ENCOUNTER — Other Ambulatory Visit: Payer: Self-pay

## 2018-03-12 ENCOUNTER — Inpatient Hospital Stay (HOSPITAL_COMMUNITY): Payer: No Typology Code available for payment source | Admitting: Physician Assistant

## 2018-03-12 DIAGNOSIS — N138 Other obstructive and reflux uropathy: Secondary | ICD-10-CM | POA: Diagnosis present

## 2018-03-12 DIAGNOSIS — E785 Hyperlipidemia, unspecified: Secondary | ICD-10-CM | POA: Diagnosis present

## 2018-03-12 DIAGNOSIS — I2582 Chronic total occlusion of coronary artery: Secondary | ICD-10-CM | POA: Diagnosis present

## 2018-03-12 DIAGNOSIS — D62 Acute posthemorrhagic anemia: Secondary | ICD-10-CM | POA: Diagnosis not present

## 2018-03-12 DIAGNOSIS — N401 Enlarged prostate with lower urinary tract symptoms: Secondary | ICD-10-CM | POA: Diagnosis present

## 2018-03-12 DIAGNOSIS — I10 Essential (primary) hypertension: Secondary | ICD-10-CM | POA: Diagnosis present

## 2018-03-12 DIAGNOSIS — Z87891 Personal history of nicotine dependence: Secondary | ICD-10-CM

## 2018-03-12 DIAGNOSIS — Z8611 Personal history of tuberculosis: Secondary | ICD-10-CM | POA: Diagnosis not present

## 2018-03-12 DIAGNOSIS — I493 Ventricular premature depolarization: Secondary | ICD-10-CM | POA: Diagnosis not present

## 2018-03-12 DIAGNOSIS — I252 Old myocardial infarction: Secondary | ICD-10-CM

## 2018-03-12 DIAGNOSIS — Z951 Presence of aortocoronary bypass graft: Secondary | ICD-10-CM

## 2018-03-12 DIAGNOSIS — Z09 Encounter for follow-up examination after completed treatment for conditions other than malignant neoplasm: Secondary | ICD-10-CM

## 2018-03-12 DIAGNOSIS — Z8249 Family history of ischemic heart disease and other diseases of the circulatory system: Secondary | ICD-10-CM

## 2018-03-12 DIAGNOSIS — I2510 Atherosclerotic heart disease of native coronary artery without angina pectoris: Secondary | ICD-10-CM | POA: Diagnosis present

## 2018-03-12 DIAGNOSIS — Z87442 Personal history of urinary calculi: Secondary | ICD-10-CM

## 2018-03-12 DIAGNOSIS — H9190 Unspecified hearing loss, unspecified ear: Secondary | ICD-10-CM | POA: Diagnosis present

## 2018-03-12 HISTORY — PX: TEE WITHOUT CARDIOVERSION: SHX5443

## 2018-03-12 HISTORY — PX: CORONARY ARTERY BYPASS GRAFT: SHX141

## 2018-03-12 LAB — POCT I-STAT 7, (LYTES, BLD GAS, ICA,H+H)
Acid-Base Excess: 2 mmol/L (ref 0.0–2.0)
Acid-base deficit: 1 mmol/L (ref 0.0–2.0)
Acid-base deficit: 1 mmol/L (ref 0.0–2.0)
Acid-base deficit: 4 mmol/L — ABNORMAL HIGH (ref 0.0–2.0)
Bicarbonate: 27 mmol/L (ref 20.0–28.0)
Bicarbonate: 22.4 mmol/L (ref 20.0–28.0)
Bicarbonate: 24.1 mmol/L (ref 20.0–28.0)
Bicarbonate: 20.5 mmol/L (ref 20.0–28.0)
Calcium, Ion: 1.01 mmol/L — ABNORMAL LOW (ref 1.15–1.40)
Calcium, Ion: 1.36 mmol/L (ref 1.15–1.40)
Calcium, Ion: 1.13 mmol/L — ABNORMAL LOW (ref 1.15–1.40)
Calcium, Ion: 1.1 mmol/L — ABNORMAL LOW (ref 1.15–1.40)
HCT: 29 % — ABNORMAL LOW (ref 39.0–52.0)
HCT: 28 % — ABNORMAL LOW (ref 39.0–52.0)
HCT: 31 % — ABNORMAL LOW (ref 39.0–52.0)
HCT: 32 % — ABNORMAL LOW (ref 39.0–52.0)
Hemoglobin: 9.9 g/dL — ABNORMAL LOW (ref 13.0–17.0)
Hemoglobin: 9.5 g/dL — ABNORMAL LOW (ref 13.0–17.0)
Hemoglobin: 10.5 g/dL — ABNORMAL LOW (ref 13.0–17.0)
Hemoglobin: 10.9 g/dL — ABNORMAL LOW (ref 13.0–17.0)
O2 Saturation: 100 %
O2 Saturation: 92 %
O2 Saturation: 100 %
O2 Saturation: 93 %
Patient temperature: 35.9
Patient temperature: 37.1
Potassium: 4.2 mmol/L (ref 3.5–5.1)
Potassium: 4 mmol/L (ref 3.5–5.1)
Potassium: 3.5 mmol/L (ref 3.5–5.1)
Potassium: 3.6 mmol/L (ref 3.5–5.1)
Sodium: 140 mmol/L (ref 135–145)
Sodium: 139 mmol/L (ref 135–145)
Sodium: 143 mmol/L (ref 135–145)
Sodium: 142 mmol/L (ref 135–145)
TCO2: 28 mmol/L (ref 22–32)
TCO2: 23 mmol/L (ref 22–32)
TCO2: 25 mmol/L (ref 22–32)
TCO2: 22 mmol/L (ref 22–32)
pCO2 arterial: 44.4 mmHg (ref 32.0–48.0)
pCO2 arterial: 33.6 mmHg (ref 32.0–48.0)
pCO2 arterial: 40.7 mmHg (ref 32.0–48.0)
pCO2 arterial: 36.4 mmHg (ref 32.0–48.0)
pH, Arterial: 7.392 (ref 7.350–7.450)
pH, Arterial: 7.433 (ref 7.350–7.450)
pH, Arterial: 7.375 (ref 7.350–7.450)
pH, Arterial: 7.36 (ref 7.350–7.450)
pO2, Arterial: 303 mmHg — ABNORMAL HIGH (ref 83.0–108.0)
pO2, Arterial: 62 mmHg — ABNORMAL LOW (ref 83.0–108.0)
pO2, Arterial: 188 mmHg — ABNORMAL HIGH (ref 83.0–108.0)
pO2, Arterial: 70 mmHg — ABNORMAL LOW (ref 83.0–108.0)

## 2018-03-12 LAB — CBC
HCT: 35 % — ABNORMAL LOW (ref 39.0–52.0)
HCT: 37.5 % — ABNORMAL LOW (ref 39.0–52.0)
Hemoglobin: 11.9 g/dL — ABNORMAL LOW (ref 13.0–17.0)
Hemoglobin: 12.8 g/dL — ABNORMAL LOW (ref 13.0–17.0)
MCH: 28.7 pg (ref 26.0–34.0)
MCH: 28.8 pg (ref 26.0–34.0)
MCHC: 34 g/dL (ref 30.0–36.0)
MCHC: 34.1 g/dL (ref 30.0–36.0)
MCV: 84.3 fL (ref 80.0–100.0)
MCV: 84.5 fL (ref 80.0–100.0)
Platelets: 121 10*3/uL — ABNORMAL LOW (ref 150–400)
Platelets: 138 10*3/uL — ABNORMAL LOW (ref 150–400)
RBC: 4.15 MIL/uL — ABNORMAL LOW (ref 4.22–5.81)
RBC: 4.44 MIL/uL (ref 4.22–5.81)
RDW: 11.7 % (ref 11.5–15.5)
RDW: 11.9 % (ref 11.5–15.5)
WBC: 10.8 10*3/uL — ABNORMAL HIGH (ref 4.0–10.5)
WBC: 11.1 10*3/uL — ABNORMAL HIGH (ref 4.0–10.5)
nRBC: 0 % (ref 0.0–0.2)
nRBC: 0 % (ref 0.0–0.2)

## 2018-03-12 LAB — BASIC METABOLIC PANEL
Anion gap: 7 (ref 5–15)
BUN: 9 mg/dL (ref 8–23)
CO2: 22 mmol/L (ref 22–32)
Calcium: 7.6 mg/dL — ABNORMAL LOW (ref 8.9–10.3)
Chloride: 110 mmol/L (ref 98–111)
Creatinine, Ser: 0.88 mg/dL (ref 0.61–1.24)
GFR calc Af Amer: >60 mL/min (ref >60)
GFR calc non Af Amer: >60 mL/min (ref >60)
Glucose, Bld: 134 mg/dL — ABNORMAL HIGH (ref 70–99)
Potassium: 3.8 mmol/L (ref 3.5–5.1)
Sodium: 139 mmol/L (ref 135–145)

## 2018-03-12 LAB — GLUCOSE, CAPILLARY
Glucose-Capillary: 100 mg/dL — ABNORMAL HIGH (ref 70–99)
Glucose-Capillary: 84 mg/dL (ref 70–99)
Glucose-Capillary: 106 mg/dL — ABNORMAL HIGH (ref 70–99)
Glucose-Capillary: 134 mg/dL — ABNORMAL HIGH (ref 70–99)
Glucose-Capillary: 63 mg/dL — ABNORMAL LOW (ref 70–99)
Glucose-Capillary: 128 mg/dL — ABNORMAL HIGH (ref 70–99)
Glucose-Capillary: 115 mg/dL — ABNORMAL HIGH (ref 70–99)
Glucose-Capillary: 117 mg/dL — ABNORMAL HIGH (ref 70–99)

## 2018-03-12 LAB — POCT I-STAT 4, (NA,K, GLUC, HGB,HCT)
Glucose, Bld: 110 mg/dL — ABNORMAL HIGH (ref 70–99)
Glucose, Bld: 110 mg/dL — ABNORMAL HIGH (ref 70–99)
Glucose, Bld: 101 mg/dL — ABNORMAL HIGH (ref 70–99)
Glucose, Bld: 129 mg/dL — ABNORMAL HIGH (ref 70–99)
Glucose, Bld: 107 mg/dL — ABNORMAL HIGH (ref 70–99)
HCT: 37 % — ABNORMAL LOW (ref 39.0–52.0)
HCT: 33 % — ABNORMAL LOW (ref 39.0–52.0)
HCT: 28 % — ABNORMAL LOW (ref 39.0–52.0)
HCT: 29 % — ABNORMAL LOW (ref 39.0–52.0)
HCT: 27 % — ABNORMAL LOW (ref 39.0–52.0)
Hemoglobin: 12.6 g/dL — ABNORMAL LOW (ref 13.0–17.0)
Hemoglobin: 11.2 g/dL — ABNORMAL LOW (ref 13.0–17.0)
Hemoglobin: 9.5 g/dL — ABNORMAL LOW (ref 13.0–17.0)
Hemoglobin: 9.9 g/dL — ABNORMAL LOW (ref 13.0–17.0)
Hemoglobin: 9.2 g/dL — ABNORMAL LOW (ref 13.0–17.0)
Potassium: 3.7 mmol/L (ref 3.5–5.1)
Potassium: 4 mmol/L (ref 3.5–5.1)
Potassium: 4.2 mmol/L (ref 3.5–5.1)
Potassium: 4.6 mmol/L (ref 3.5–5.1)
Potassium: 3.9 mmol/L (ref 3.5–5.1)
Sodium: 140 mmol/L (ref 135–145)
Sodium: 140 mmol/L (ref 135–145)
Sodium: 141 mmol/L (ref 135–145)
Sodium: 140 mmol/L (ref 135–145)
Sodium: 140 mmol/L (ref 135–145)

## 2018-03-12 LAB — PROTIME-INR
INR: 1.41
Prothrombin Time: 17.1 seconds — ABNORMAL HIGH (ref 11.4–15.2)

## 2018-03-12 LAB — HEMOGLOBIN AND HEMATOCRIT, BLOOD
HCT: 32.4 % — ABNORMAL LOW (ref 39.0–52.0)
Hemoglobin: 11.2 g/dL — ABNORMAL LOW (ref 13.0–17.0)

## 2018-03-12 LAB — PLATELET COUNT: Platelets: 175 10*3/uL (ref 150–400)

## 2018-03-12 LAB — MAGNESIUM: Magnesium: 2.8 mg/dL — ABNORMAL HIGH (ref 1.7–2.4)

## 2018-03-12 LAB — APTT: aPTT: 35 seconds (ref 24–36)

## 2018-03-12 SURGERY — CORONARY ARTERY BYPASS GRAFTING (CABG)
Anesthesia: General | Site: Chest

## 2018-03-12 MED ORDER — DEXTROSE 50 % IV SOLN
15.0000 mL | Freq: Once | INTRAVENOUS | Status: AC
  Administered 2018-03-12: 15 mL via INTRAVENOUS

## 2018-03-12 MED ORDER — ROSUVASTATIN CALCIUM 20 MG PO TABS
20.0000 mg | ORAL_TABLET | Freq: Every day | ORAL | Status: DC
  Administered 2018-03-13 – 2018-03-16 (×4): 20 mg via ORAL
  Filled 2018-03-12 (×4): qty 1

## 2018-03-12 MED ORDER — SODIUM CHLORIDE 0.9 % IV SOLN
1.5000 g | Freq: Two times a day (BID) | INTRAVENOUS | Status: AC
  Administered 2018-03-12 – 2018-03-14 (×4): 1.5 g via INTRAVENOUS
  Filled 2018-03-12 (×4): qty 1.5

## 2018-03-12 MED ORDER — MIDAZOLAM HCL 2 MG/2ML IJ SOLN: 2.0000 mg | INTRAMUSCULAR | Status: DC | PRN

## 2018-03-12 MED ORDER — LACTATED RINGERS IV SOLN
INTRAVENOUS | Status: DC | PRN
  Administered 2018-03-12 (×4): via INTRAVENOUS

## 2018-03-12 MED ORDER — ACETAMINOPHEN 160 MG/5ML PO SOLN: 650.0000 mg | Freq: Once | ORAL | Status: AC

## 2018-03-12 MED ORDER — METOPROLOL TARTRATE 5 MG/5ML IV SOLN
2.5000 mg | INTRAVENOUS | Status: DC | PRN
  Administered 2018-03-13 (×2): 5 mg via INTRAVENOUS
  Filled 2018-03-12 (×2): qty 5

## 2018-03-12 MED ORDER — PHENYLEPHRINE 40 MCG/ML (10ML) SYRINGE FOR IV PUSH (FOR BLOOD PRESSURE SUPPORT)
PREFILLED_SYRINGE | INTRAVENOUS | Status: AC
  Filled 2018-03-12: qty 10

## 2018-03-12 MED ORDER — HEMOSTATIC AGENTS (NO CHARGE) OPTIME
TOPICAL | Status: DC | PRN
  Administered 2018-03-12 (×3): 1 via TOPICAL

## 2018-03-12 MED ORDER — LACTATED RINGERS IV SOLN: 500.0000 mL | Freq: Once | INTRAVENOUS | Status: DC | PRN

## 2018-03-12 MED ORDER — ALBUMIN HUMAN 5 % IV SOLN
INTRAVENOUS | Status: DC | PRN
  Administered 2018-03-12 (×2): via INTRAVENOUS

## 2018-03-12 MED ORDER — MAGNESIUM SULFATE 4 GM/100ML IV SOLN
4.0000 g | Freq: Once | INTRAVENOUS | Status: AC
  Administered 2018-03-12: 4 g via INTRAVENOUS
  Filled 2018-03-12: qty 100

## 2018-03-12 MED ORDER — SODIUM CHLORIDE 0.9 % IV SOLN
INTRAVENOUS | Status: DC
  Administered 2018-03-12: 13:00:00 via INTRAVENOUS

## 2018-03-12 MED ORDER — MIDAZOLAM HCL 5 MG/5ML IJ SOLN
INTRAMUSCULAR | Status: DC | PRN
  Administered 2018-03-12: 3 mg via INTRAVENOUS
  Administered 2018-03-12: 2 mg via INTRAVENOUS
  Administered 2018-03-12: 3 mg via INTRAVENOUS
  Administered 2018-03-12: 2 mg via INTRAVENOUS

## 2018-03-12 MED ORDER — VECURONIUM BROMIDE 10 MG IV SOLR
INTRAVENOUS | Status: AC
  Filled 2018-03-12: qty 10

## 2018-03-12 MED ORDER — ACETAMINOPHEN 500 MG PO TABS
1000.0000 mg | ORAL_TABLET | Freq: Four times a day (QID) | ORAL | Status: DC
  Administered 2018-03-12 – 2018-03-16 (×12): 1000 mg via ORAL
  Filled 2018-03-12 (×11): qty 2

## 2018-03-12 MED ORDER — 0.9 % SODIUM CHLORIDE (POUR BTL) OPTIME
TOPICAL | Status: DC | PRN
  Administered 2018-03-12: 6000 mL

## 2018-03-12 MED ORDER — SODIUM CHLORIDE 0.9 % IV SOLN: 250.0000 mL | INTRAVENOUS | Status: DC

## 2018-03-12 MED ORDER — SODIUM CHLORIDE 0.45 % IV SOLN
INTRAVENOUS | Status: DC | PRN
  Administered 2018-03-12: 13:00:00 via INTRAVENOUS

## 2018-03-12 MED ORDER — PROTAMINE SULFATE 10 MG/ML IV SOLN
INTRAVENOUS | Status: AC
  Filled 2018-03-12: qty 5

## 2018-03-12 MED ORDER — PROTAMINE SULFATE 10 MG/ML IV SOLN
INTRAVENOUS | Status: AC
  Filled 2018-03-12: qty 25

## 2018-03-12 MED ORDER — PHENYLEPHRINE HCL-NACL 20-0.9 MG/250ML-% IV SOLN: 0.0000 ug/min | INTRAVENOUS | Status: DC

## 2018-03-12 MED ORDER — OXYCODONE HCL 5 MG PO TABS: 5.0000 mg | ORAL_TABLET | ORAL | Status: DC | PRN

## 2018-03-12 MED ORDER — SODIUM CHLORIDE (PF) 0.9 % IJ SOLN
OROMUCOSAL | Status: DC | PRN
  Administered 2018-03-12 (×3): 4 mL via TOPICAL

## 2018-03-12 MED ORDER — ONDANSETRON HCL 4 MG/2ML IJ SOLN
4.0000 mg | Freq: Four times a day (QID) | INTRAMUSCULAR | Status: DC | PRN
  Administered 2018-03-12: 4 mg via INTRAVENOUS
  Filled 2018-03-12: qty 2

## 2018-03-12 MED ORDER — SUFENTANIL CITRATE 50 MCG/ML IV SOLN
INTRAVENOUS | Status: DC | PRN
  Administered 2018-03-12 (×2): 10 ug via INTRAVENOUS
  Administered 2018-03-12: 30 ug via INTRAVENOUS
  Administered 2018-03-12: 20 ug via INTRAVENOUS
  Administered 2018-03-12 (×2): 10 ug via INTRAVENOUS
  Administered 2018-03-12 (×2): 20 ug via INTRAVENOUS
  Administered 2018-03-12: 10 ug via INTRAVENOUS
  Administered 2018-03-12 (×2): 20 ug via INTRAVENOUS
  Administered 2018-03-12: 30 ug via INTRAVENOUS

## 2018-03-12 MED ORDER — DEXTROSE 50 % IV SOLN
INTRAVENOUS | Status: AC
  Administered 2018-03-12: 15 mL via INTRAVENOUS
  Filled 2018-03-12: qty 50

## 2018-03-12 MED ORDER — METOPROLOL TARTRATE 12.5 MG HALF TABLET
12.5000 mg | ORAL_TABLET | Freq: Once | ORAL | Status: AC
  Administered 2018-03-12: 12.5 mg via ORAL
  Filled 2018-03-12: qty 1

## 2018-03-12 MED ORDER — LACTATED RINGERS IV SOLN
INTRAVENOUS | Status: DC
  Administered 2018-03-13: 09:00:00 via INTRAVENOUS

## 2018-03-12 MED ORDER — HEPARIN SODIUM (PORCINE) 1000 UNIT/ML IJ SOLN
INTRAMUSCULAR | Status: DC | PRN
  Administered 2018-03-12: 28000 [IU] via INTRAVENOUS

## 2018-03-12 MED ORDER — CHLORHEXIDINE GLUCONATE CLOTH 2 % EX PADS
6.0000 | MEDICATED_PAD | Freq: Every day | CUTANEOUS | Status: DC
  Administered 2018-03-12 – 2018-03-13 (×2): 6 via TOPICAL

## 2018-03-12 MED ORDER — ACETAMINOPHEN 650 MG RE SUPP
650.0000 mg | Freq: Once | RECTAL | Status: AC
  Administered 2018-03-12: 650 mg via RECTAL

## 2018-03-12 MED ORDER — BISACODYL 10 MG RE SUPP: 10.0000 mg | Freq: Every day | RECTAL | Status: DC

## 2018-03-12 MED ORDER — NITROGLYCERIN IN D5W 200-5 MCG/ML-% IV SOLN: 0.0000 ug/min | INTRAVENOUS | Status: DC

## 2018-03-12 MED ORDER — PROPOFOL 10 MG/ML IV BOLUS
INTRAVENOUS | Status: AC
  Filled 2018-03-12: qty 20

## 2018-03-12 MED ORDER — ACETAMINOPHEN 160 MG/5ML PO SOLN: 1000.0000 mg | Freq: Four times a day (QID) | ORAL | Status: DC

## 2018-03-12 MED ORDER — INSULIN ASPART 100 UNIT/ML ~~LOC~~ SOLN
0.0000 [IU] | SUBCUTANEOUS | Status: DC
  Administered 2018-03-13: 2 [IU] via SUBCUTANEOUS

## 2018-03-12 MED ORDER — SODIUM CHLORIDE 0.9 % IV SOLN
INTRAVENOUS | Status: DC | PRN
  Administered 2018-03-12: 750 mg via INTRAVENOUS

## 2018-03-12 MED ORDER — SODIUM CHLORIDE 0.9 % IV SOLN
INTRAVENOUS | Status: DC | PRN
  Administered 2018-03-12: 13:00:00 via INTRAVENOUS

## 2018-03-12 MED ORDER — INSULIN REGULAR BOLUS VIA INFUSION
0.0000 [IU] | Freq: Three times a day (TID) | INTRAVENOUS | Status: DC
  Filled 2018-03-12: qty 10

## 2018-03-12 MED ORDER — CALCIUM CHLORIDE 10 % IV SOLN
INTRAVENOUS | Status: AC
  Filled 2018-03-12: qty 10

## 2018-03-12 MED ORDER — MORPHINE SULFATE (PF) 2 MG/ML IV SOLN
1.0000 mg | INTRAVENOUS | Status: DC | PRN
  Administered 2018-03-12: 2 mg via INTRAVENOUS
  Administered 2018-03-12 (×2): 1 mg via INTRAVENOUS
  Filled 2018-03-12 (×3): qty 1

## 2018-03-12 MED ORDER — PANTOPRAZOLE SODIUM 40 MG PO TBEC
40.0000 mg | DELAYED_RELEASE_TABLET | Freq: Every day | ORAL | Status: DC
  Administered 2018-03-14 – 2018-03-16 (×3): 40 mg via ORAL
  Filled 2018-03-12 (×3): qty 1

## 2018-03-12 MED ORDER — SODIUM CHLORIDE 0.9% FLUSH: 3.0000 mL | INTRAVENOUS | Status: DC | PRN

## 2018-03-12 MED ORDER — BISACODYL 5 MG PO TBEC
10.0000 mg | DELAYED_RELEASE_TABLET | Freq: Every day | ORAL | Status: DC
  Administered 2018-03-13 – 2018-03-14 (×2): 10 mg via ORAL
  Filled 2018-03-12 (×3): qty 2

## 2018-03-12 MED ORDER — PHENYLEPHRINE 40 MCG/ML (10ML) SYRINGE FOR IV PUSH (FOR BLOOD PRESSURE SUPPORT)
PREFILLED_SYRINGE | INTRAVENOUS | Status: DC | PRN
  Administered 2018-03-12: 80 ug via INTRAVENOUS

## 2018-03-12 MED ORDER — SODIUM CHLORIDE 0.9% FLUSH
10.0000 mL | Freq: Two times a day (BID) | INTRAVENOUS | Status: DC
  Administered 2018-03-12 – 2018-03-13 (×3): 10 mL

## 2018-03-12 MED ORDER — MIDAZOLAM HCL (PF) 10 MG/2ML IJ SOLN
INTRAMUSCULAR | Status: AC
  Filled 2018-03-12: qty 2

## 2018-03-12 MED ORDER — SODIUM CHLORIDE 0.9% FLUSH
3.0000 mL | Freq: Two times a day (BID) | INTRAVENOUS | Status: DC
  Administered 2018-03-13 – 2018-03-14 (×3): 3 mL via INTRAVENOUS

## 2018-03-12 MED ORDER — TRAMADOL HCL 50 MG PO TABS
50.0000 mg | ORAL_TABLET | ORAL | Status: DC | PRN
  Administered 2018-03-13 – 2018-03-15 (×5): 50 mg via ORAL
  Filled 2018-03-12 (×2): qty 1
  Filled 2018-03-12: qty 2
  Filled 2018-03-12 (×2): qty 1

## 2018-03-12 MED ORDER — KETOROLAC TROMETHAMINE 15 MG/ML IJ SOLN
15.0000 mg | Freq: Four times a day (QID) | INTRAMUSCULAR | Status: AC
  Administered 2018-03-12 – 2018-03-13 (×3): 15 mg via INTRAVENOUS
  Filled 2018-03-12 (×3): qty 1

## 2018-03-12 MED ORDER — ASPIRIN EC 325 MG PO TBEC
325.0000 mg | DELAYED_RELEASE_TABLET | Freq: Every day | ORAL | Status: DC
  Administered 2018-03-13 – 2018-03-16 (×4): 325 mg via ORAL
  Filled 2018-03-12 (×4): qty 1

## 2018-03-12 MED ORDER — LACTATED RINGERS IV SOLN: INTRAVENOUS | Status: DC

## 2018-03-12 MED ORDER — POTASSIUM CHLORIDE 10 MEQ/50ML IV SOLN
10.0000 meq | INTRAVENOUS | Status: AC
  Administered 2018-03-12 (×3): 10 meq via INTRAVENOUS

## 2018-03-12 MED ORDER — METOPROLOL TARTRATE 12.5 MG HALF TABLET
12.5000 mg | ORAL_TABLET | Freq: Two times a day (BID) | ORAL | Status: DC
  Administered 2018-03-12: 12.5 mg via ORAL
  Filled 2018-03-12 (×3): qty 1

## 2018-03-12 MED ORDER — CHLORHEXIDINE GLUCONATE 0.12 % MT SOLN
15.0000 mL | OROMUCOSAL | Status: AC
  Administered 2018-03-12: 15 mL via OROMUCOSAL

## 2018-03-12 MED ORDER — FAMOTIDINE IN NACL 20-0.9 MG/50ML-% IV SOLN
20.0000 mg | Freq: Two times a day (BID) | INTRAVENOUS | Status: AC
  Administered 2018-03-12 (×2): 20 mg via INTRAVENOUS
  Filled 2018-03-12: qty 50

## 2018-03-12 MED ORDER — HEPARIN SODIUM (PORCINE) 1000 UNIT/ML IJ SOLN
INTRAMUSCULAR | Status: AC
  Filled 2018-03-12: qty 1

## 2018-03-12 MED ORDER — SUFENTANIL CITRATE 250 MCG/5ML IV SOLN
INTRAVENOUS | Status: AC
  Filled 2018-03-12: qty 5

## 2018-03-12 MED ORDER — ALBUMIN HUMAN 5 % IV SOLN
250.0000 mL | INTRAVENOUS | Status: AC | PRN
  Administered 2018-03-12 (×2): 12.5 g via INTRAVENOUS

## 2018-03-12 MED ORDER — DEXMEDETOMIDINE HCL IN NACL 200 MCG/50ML IV SOLN
0.0000 ug/kg/h | INTRAVENOUS | Status: DC
  Filled 2018-03-12: qty 50

## 2018-03-12 MED ORDER — DOCUSATE SODIUM 100 MG PO CAPS
200.0000 mg | ORAL_CAPSULE | Freq: Every day | ORAL | Status: DC
  Administered 2018-03-13 – 2018-03-14 (×2): 200 mg via ORAL
  Filled 2018-03-12 (×3): qty 2

## 2018-03-12 MED ORDER — CALCIUM CHLORIDE 10 % IV SOLN
INTRAVENOUS | Status: DC | PRN
  Administered 2018-03-12: 100 mg via INTRAVENOUS
  Administered 2018-03-12 (×2): 200 mg via INTRAVENOUS

## 2018-03-12 MED ORDER — VECURONIUM BROMIDE 10 MG IV SOLR
INTRAVENOUS | Status: DC | PRN
  Administered 2018-03-12 (×2): 3 mg via INTRAVENOUS
  Administered 2018-03-12: 2 mg via INTRAVENOUS
  Administered 2018-03-12: 3 mg via INTRAVENOUS
  Administered 2018-03-12: 7 mg via INTRAVENOUS

## 2018-03-12 MED ORDER — CHLORHEXIDINE GLUCONATE 0.12 % MT SOLN
15.0000 mL | Freq: Once | OROMUCOSAL | Status: AC
  Administered 2018-03-12: 15 mL via OROMUCOSAL
  Filled 2018-03-12: qty 15

## 2018-03-12 MED ORDER — CHLORHEXIDINE GLUCONATE 4 % EX LIQD: 30.0000 mL | CUTANEOUS | Status: DC

## 2018-03-12 MED ORDER — INSULIN REGULAR(HUMAN) IN NACL 100-0.9 UT/100ML-% IV SOLN: INTRAVENOUS | Status: DC

## 2018-03-12 MED ORDER — ASPIRIN EC 81 MG PO TBEC: 81.0000 mg | DELAYED_RELEASE_TABLET | Freq: Every day | ORAL | Status: DC

## 2018-03-12 MED ORDER — MUPIROCIN 2 % EX OINT
1.0000 "application " | TOPICAL_OINTMENT | Freq: Two times a day (BID) | CUTANEOUS | Status: DC
  Administered 2018-03-12 – 2018-03-15 (×8): 1 via NASAL
  Filled 2018-03-12: qty 22

## 2018-03-12 MED ORDER — PROTAMINE SULFATE 10 MG/ML IV SOLN
INTRAVENOUS | Status: DC | PRN
  Administered 2018-03-12: 120 mg via INTRAVENOUS
  Administered 2018-03-12: 20 mg via INTRAVENOUS
  Administered 2018-03-12: 140 mg via INTRAVENOUS

## 2018-03-12 MED ORDER — METOPROLOL TARTRATE 25 MG/10 ML ORAL SUSPENSION: 12.5000 mg | Freq: Two times a day (BID) | ORAL | Status: DC

## 2018-03-12 MED ORDER — ASPIRIN 81 MG PO CHEW: 324.0000 mg | CHEWABLE_TABLET | Freq: Every day | ORAL | Status: DC

## 2018-03-12 MED ORDER — VANCOMYCIN HCL IN DEXTROSE 1-5 GM/200ML-% IV SOLN
1000.0000 mg | Freq: Once | INTRAVENOUS | Status: AC
  Administered 2018-03-12: 1000 mg via INTRAVENOUS
  Filled 2018-03-12: qty 200

## 2018-03-12 MED ORDER — SODIUM CHLORIDE 0.9% FLUSH: 10.0000 mL | INTRAVENOUS | Status: DC | PRN

## 2018-03-12 MED ORDER — PROPOFOL 10 MG/ML IV BOLUS
INTRAVENOUS | Status: DC | PRN
  Administered 2018-03-12: 120 mg via INTRAVENOUS

## 2018-03-12 SURGICAL SUPPLY — 74 items
BAG DECANTER FOR FLEXI CONT (MISCELLANEOUS) ×3 IMPLANT
BANDAGE ACE 4X5 VEL STRL LF (GAUZE/BANDAGES/DRESSINGS) ×3 IMPLANT
BANDAGE ACE 6X5 VEL STRL LF (GAUZE/BANDAGES/DRESSINGS) ×3 IMPLANT
BANDAGE ELASTIC 4 VELCRO ST LF (GAUZE/BANDAGES/DRESSINGS) ×3 IMPLANT
BANDAGE ELASTIC 6 VELCRO ST LF (GAUZE/BANDAGES/DRESSINGS) ×3 IMPLANT
BLADE STERNUM SYSTEM 6 (BLADE) ×3 IMPLANT
BNDG GAUZE ELAST 4 BULKY (GAUZE/BANDAGES/DRESSINGS) ×3 IMPLANT
CANISTER SUCT 3000ML PPV (MISCELLANEOUS) ×3 IMPLANT
CATH CPB KIT GERHARDT (MISCELLANEOUS) ×3 IMPLANT
CATH THORACIC 28FR (CATHETERS) ×3 IMPLANT
COVER WAND RF STERILE (DRAPES) ×3 IMPLANT
CRADLE DONUT ADULT HEAD (MISCELLANEOUS) ×3 IMPLANT
DERMABOND ADVANCED (GAUZE/BANDAGES/DRESSINGS) ×1
DERMABOND ADVANCED .7 DNX12 (GAUZE/BANDAGES/DRESSINGS) ×2 IMPLANT
DRAIN CHANNEL 28F RND 3/8 FF (WOUND CARE) ×3 IMPLANT
DRAPE CARDIOVASCULAR INCISE (DRAPES) ×2
DRAPE HALF SHEET 40X57 (DRAPES) ×9 IMPLANT
DRAPE SLUSH/WARMER DISC (DRAPES) ×3 IMPLANT
DRAPE SRG 135X102X78XABS (DRAPES) ×4 IMPLANT
DRSG AQUACEL AG ADV 3.5X14 (GAUZE/BANDAGES/DRESSINGS) ×3 IMPLANT
DRSG TEGADERM 4X4.75 (GAUZE/BANDAGES/DRESSINGS) ×3 IMPLANT
ELECT BLADE 4.0 EZ CLEAN MEGAD (MISCELLANEOUS) ×3
ELECT REM PT RETURN 9FT ADLT (ELECTROSURGICAL) ×6
ELECTRODE BLDE 4.0 EZ CLN MEGD (MISCELLANEOUS) ×2 IMPLANT
ELECTRODE REM PT RTRN 9FT ADLT (ELECTROSURGICAL) ×4 IMPLANT
FELT TEFLON 1X6 (MISCELLANEOUS) ×6 IMPLANT
GAUZE SPONGE 4X4 12PLY STRL (GAUZE/BANDAGES/DRESSINGS) ×3 IMPLANT
GAUZE SPONGE 4X4 12PLY STRL LF (GAUZE/BANDAGES/DRESSINGS) ×3 IMPLANT
GLOVE BIO SURGEON STRL SZ 6.5 (GLOVE) ×39 IMPLANT
GLOVE BIO SURGEON STRL SZ7.5 (GLOVE) ×6 IMPLANT
GOWN STRL REUS W/ TWL LRG LVL3 (GOWN DISPOSABLE) ×20 IMPLANT
GOWN STRL REUS W/TWL LRG LVL3 (GOWN DISPOSABLE) ×10
HEMOSTAT POWDER SURGIFOAM 1G (HEMOSTASIS) ×9 IMPLANT
HEMOSTAT SURGICEL 2X14 (HEMOSTASIS) ×3 IMPLANT
KIT BASIN OR (CUSTOM PROCEDURE TRAY) ×3 IMPLANT
KIT CATH SUCT 8FR (CATHETERS) ×3 IMPLANT
KIT SUCTION CATH 14FR (SUCTIONS) ×6 IMPLANT
KIT TURNOVER KIT B (KITS) ×3 IMPLANT
KIT VASOVIEW HEMOPRO 2 VH 4000 (KITS) ×3 IMPLANT
LEAD PACING MYOCARDI (MISCELLANEOUS) ×6 IMPLANT
MARKER GRAFT CORONARY BYPASS (MISCELLANEOUS) ×9 IMPLANT
NS IRRIG 1000ML POUR BTL (IV SOLUTION) ×18 IMPLANT
PACK E OPEN HEART (SUTURE) ×3 IMPLANT
PACK OPEN HEART (CUSTOM PROCEDURE TRAY) ×3 IMPLANT
PAD ARMBOARD 7.5X6 YLW CONV (MISCELLANEOUS) ×6 IMPLANT
PAD ELECT DEFIB RADIOL ZOLL (MISCELLANEOUS) ×3 IMPLANT
PENCIL BUTTON HOLSTER BLD 10FT (ELECTRODE) ×3 IMPLANT
PUNCH AORTIC ROT 4.0MM RCL 40 (MISCELLANEOUS) ×3 IMPLANT
SET CARDIOPLEGIA MPS 5001102 (MISCELLANEOUS) ×3 IMPLANT
SPONGE LAP 18X18 RF (DISPOSABLE) ×9 IMPLANT
SPONGE LAP 18X18 X RAY DECT (DISPOSABLE) ×6 IMPLANT
SUT BONE WAX W31G (SUTURE) ×3 IMPLANT
SUT PROLENE 3 0 SH1 36 (SUTURE) ×3 IMPLANT
SUT PROLENE 4 0 TF (SUTURE) ×6 IMPLANT
SUT PROLENE 5 0 C 1 36 (SUTURE) ×3 IMPLANT
SUT PROLENE 6 0 CC (SUTURE) ×9 IMPLANT
SUT PROLENE 7 0 BV1 MDA (SUTURE) ×6 IMPLANT
SUT PROLENE 8 0 BV175 6 (SUTURE) ×24 IMPLANT
SUT SILK 2 0 SH CR/8 (SUTURE) ×3 IMPLANT
SUT STEEL 6MS V (SUTURE) ×3 IMPLANT
SUT STEEL SZ 6 DBL 3X14 BALL (SUTURE) ×3 IMPLANT
SUT VIC AB 1 CTX 18 (SUTURE) ×6 IMPLANT
SUT VIC AB 2-0 CT1 27 (SUTURE) ×1
SUT VIC AB 2-0 CT1 TAPERPNT 27 (SUTURE) ×2 IMPLANT
SUT VIC AB 3-0 X1 27 (SUTURE) ×6 IMPLANT
SYSTEM SAHARA CHEST DRAIN ATS (WOUND CARE) ×3 IMPLANT
TAPE CLOTH SURG 4X10 WHT LF (GAUZE/BANDAGES/DRESSINGS) ×3 IMPLANT
TAPE PAPER 2X10 WHT MICROPORE (GAUZE/BANDAGES/DRESSINGS) ×3 IMPLANT
TOWEL GREEN STERILE (TOWEL DISPOSABLE) ×3 IMPLANT
TOWEL GREEN STERILE FF (TOWEL DISPOSABLE) ×3 IMPLANT
TRAY FOLEY SLVR 16FR TEMP STAT (SET/KITS/TRAYS/PACK) ×3 IMPLANT
TUBING INSUFFLATION (TUBING) ×3 IMPLANT
UNDERPAD 30X30 (UNDERPADS AND DIAPERS) ×3 IMPLANT
WATER STERILE IRR 1000ML POUR (IV SOLUTION) ×6 IMPLANT

## 2018-03-12 NOTE — Progress Notes (Signed)
  Echocardiogram Echocardiogram Transesophageal has been performed.  Jeffery Hunter 03/12/2018, 8:45 AM

## 2018-03-12 NOTE — Procedures (Signed)
Extubation Procedure Note  Patient Details:   Name: Jeffery Hunter DOB: 08-23-1955 MRN: [id number]   Airway Documentation:  Airway 8 mm (Active)  Secured at (cm) 22 cm 03/12/2018  1:25 PM  Measured From Lips 03/12/2018  1:25 PM  Secured Location Right 03/12/2018  1:25 PM  Secured By Pink Tape 03/12/2018  1:25 PM   Vent end date: (not recorded) Vent end time: (not recorded)   Evaluation  O2 sats: stable throughout Complications: No apparent complications Patient did tolerate procedure well. Bilateral Breath Sounds: Clear, Diminished   Yes  Pre-extubation pt had a NIF of -38.  And a VC of 1.7L.  Extubated to [place]. Oriented to time and place  Rachael Ferrie 03/12/2018, 4:06 PM

## 2018-03-12 NOTE — Progress Notes (Signed)
CT surgery p.m. Rounds  Patient alert and responsive and extubated after CABG x4 Stable hemodynamics Chest tube output minimal Some surgical discomfort-we will give low-dose Toradol with normal renal function

## 2018-03-12 NOTE — OR Nursing (Signed)
12:00 - 45 minute call to SICU charge nurse

## 2018-03-12 NOTE — Transfer of Care (Signed)
Immediate Anesthesia Transfer of Care Note  Patient: Jeffery Hunter  Procedure(s) Performed: CORONARY ARTERY BYPASS GRAFTING (CABG) x four , using left internal mammary artery to LAD and sequential vein graft  to intermediate and diagonal and vein graft to posterior descending.   right thigh  greater saphenous endovein harvest (N/A Chest) TRANSESOPHAGEAL ECHOCARDIOGRAM (TEE) (N/A )  Patient Location: ICU  Anesthesia Type:General  Level of Consciousness: unresponsive and Patient remains intubated per anesthesia plan  Airway & Oxygen Therapy: Patient remains intubated per anesthesia plan and Patient placed on Ventilator (see vital sign flow sheet for setting)  Post-op Assessment: Report given to RN, Post -op Vital signs reviewed and stable and Patient moving all extremities  Post vital signs: Reviewed and stable  Last Vitals:  Vitals Value Taken Time  BP    Temp    Pulse 89 03/12/2018  1:22 PM  Resp 12 03/12/2018  1:22 PM  SpO2 99 % 03/12/2018  1:22 PM  Vitals shown include unvalidated device data.  Last Pain:  Vitals:   03/12/18 0608  TempSrc:   PainSc: 0-No pain         Complications: No apparent anesthesia complications

## 2018-03-12 NOTE — Anesthesia Procedure Notes (Signed)
Central Venous Catheter Insertion Performed by: Lillia Abed, MD, anesthesiologist Start/End1/22/2020 6:45 AM, 03/12/2018 7:00 AM Patient location: Pre-op. Preanesthetic checklist: patient identified, IV checked, risks and benefits discussed, surgical consent, monitors and equipment checked, pre-op evaluation, timeout performed and anesthesia consent Position: Trendelenburg Lidocaine 1% used for infiltration and patient sedated Hand hygiene performed  and maximum sterile barriers used  Catheter size: 8.5 Fr PA cath and Central line was placed.MAC introducer Swan type:thermodilution Procedure performed using ultrasound guided technique. Ultrasound Notes:anatomy identified, needle tip was noted to be adjacent to the nerve/plexus identified, no ultrasound evidence of intravascular and/or intraneural injection and image(s) printed for medical record Attempts: 1 Following insertion, line sutured, dressing applied and Biopatch. Post procedure assessment: blood return through all ports, free fluid flow and no air  Patient tolerated the procedure well with no immediate complications.

## 2018-03-12 NOTE — Anesthesia Procedure Notes (Signed)
Arterial Line Insertion Start/End1/22/2020 6:50 AM, 03/12/2018 7:00 AM Performed by: Moshe Salisbury, CRNA, CRNA  Patient location: Pre-op. Preanesthetic checklist: patient identified, IV checked, site marked, risks and benefits discussed, surgical consent, monitors and equipment checked, pre-op evaluation, timeout performed and anesthesia consent Lidocaine 1% used for infiltration and patient sedated Left, radial was placed Catheter size: 20 G Hand hygiene performed , maximum sterile barriers used  and Seldinger technique used Allen's test indicative of satisfactory collateral circulation Attempts: 1 Procedure performed without using ultrasound guided technique. Following insertion, Biopatch. Post procedure assessment: normal  Patient tolerated the procedure well with no immediate complications.

## 2018-03-12 NOTE — OR Nursing (Signed)
12:05 - called volunteer desk to update patient's family per Dr. Servando Snare

## 2018-03-12 NOTE — Anesthesia Procedure Notes (Signed)
Procedure Name: Intubation Date/Time: 03/12/2018 7:45 AM Performed by: Moshe Salisbury, CRNA Pre-anesthesia Checklist: Patient identified, Emergency Drugs available, Suction available and Patient being monitored Patient Re-evaluated:Patient Re-evaluated prior to induction Oxygen Delivery Method: Circle System Utilized Preoxygenation: Pre-oxygenation with 100% oxygen Induction Type: IV induction Ventilation: Mask ventilation without difficulty Laryngoscope Size: Mac and 4 Grade View: Grade II Tube type: Oral Tube size: 8.0 mm Number of attempts: 1 Airway Equipment and Method: Stylet Placement Confirmation: ETT inserted through vocal cords under direct vision,  positive ETCO2 and breath sounds checked- equal and bilateral Secured at: 22 cm Tube secured with: Tape Dental Injury: Teeth and Oropharynx as per pre-operative assessment

## 2018-03-12 NOTE — H&P (Signed)
[address]       [place] [address]             [phone]                    Jakeem J Fralix [place] Medical Record #[id number] Date of Birth: [birth year]  Referring: Jettie Booze, MD Primary Care: Crist Infante, MD Primary Cardiologist: Larae Grooms, MD  Chief Complaint:    Chief Complaint  Patient presents with  . Coronary Artery Disease    Surgical eval, Cardiac Cath 03/04/2018, ECHO 07/01/2017, CT Coronary 02/14/18    History of Present Illness:    Jeffery Hunter 63 y.o. male is seen in the office  today for consideration of coronary artery bypass grafting.  The patient is a practicing orthopedic surgeon notes that he exercises on a daily basis without anginal symptoms.  Does note some fatigue in the evenings after working all day, but then goes to the gym  For a hour workout getting his heart rate to 130 without symptoms.  The patient has a positive family history of coronary artery disease including his father at age 6 having coronary artery bypass grafting grandfather died at 53 of a myocardial infarction.  4 to 5 years ago he had a questionable history of SVT but this had never recurred.  Several years ago he was noted on a routine annual EKG to have very small Q waves.  More recently he had a calcium score, noted to be 1100.  This led to cardiac CT suggesting coronary artery disease possibly high-grade stenosis in the right coronary artery was started on Plavix and underwent cardiac catheterization by Dr. Irish Lack several days ago.  The time of cardiac catheterization his right coronary artery was noted to be totally occluded with collateral flow from the LAD with 75% stenosis of the LAD near the takeoff of the septal there was a 90% stenosis of the small intermediate the circumflex was a very large vessel without significant disease.  FFR calculations were done from the cardiac CT, suggesting hemodynamic significance of the LAD  lesion.    Current Activity/ Functional Status:  Patient is independent with mobility/ambulation, transfers, ADL's, IADL's.   Zubrod Score: At the time of surgery this patient's most appropriate activity status/level should be described as: [x]     0    Normal activity, no symptoms []     1    Restricted in physical strenuous activity but ambulatory, able to do out light work []     2    Ambulatory and capable of self care, unable to do work activities, up and about               >50 % of waking hours                              []     3    Only limited self care, in bed greater than 50% of waking hours []     4    Completely disabled, no self care, confined to bed or chair []     5    Moribund   Past Medical History:  Diagnosis Date  . Adenomatous polyp of colon   . BPH with urinary obstruction   . Coronary artery disease   . High frequency hearing loss   . History of kidney stones   . Hx of colonoscopy   .  Hyperlipemia   . Impacted cerumen of both ears   . Kidney stones   . Nephrolithiasis   . Tuberculosis     Past Surgical History:  Procedure Laterality Date  . CYSTOSCOPY WITH RETROGRADE PYELOGRAM, URETEROSCOPY AND STENT PLACEMENT Bilateral 10/16/2013   Procedure: CYSTOSCOPY WITH BILATERAL RETROGRADE PYELOGRAM, BILATERAL URETEROSCOPY AND bilateral STENT PLACEMENT, bilateral urethral dilation HOLMIUM LASER;  Surgeon: Claybon Jabs, MD;  Location: [facility];  Service: Urology;  Laterality: Bilateral;  . HOLMIUM LASER APPLICATION Bilateral 08/18/5282   Procedure: HOLMIUM LASER APPLICATION;  Surgeon: Claybon Jabs, MD;  Location: [facility];  Service: Urology;  Laterality: Bilateral;  . LEFT HEART CATH AND CORONARY ANGIOGRAPHY N/A 03/04/2018   Procedure: LEFT HEART CATH AND CORONARY ANGIOGRAPHY;  Surgeon: Jettie Booze, MD;  Location: [facility];  Service: Cardiovascular;  Laterality: N/A;    Family History  Problem Relation Age of Onset  . Hypertension Mother   .  Alcohol abuse Mother   . Alzheimer's disease Mother   . Prostate cancer Father   . Colon polyps Father   . Heart Problems Father        CABG @76    . Alcohol abuse Maternal Grandmother   . Heart Problems Maternal Grandfather   . AAA (abdominal aortic aneurysm) Maternal Grandfather   . Breast cancer Paternal Grandmother        meta.  . Alcohol abuse Paternal Grandfather   . HIV Brother   . Healthy Son   . Healthy Daughter   . Healthy Daughter   . Healthy Grandchild      Social History   Tobacco Use  Smoking Status Former Smoker  . Packs/day: 0.25  . Types: Cigarettes  Smokeless Tobacco Never Used    Social History   Substance and Sexual Activity  Alcohol Use Yes  . Alcohol/week: 7.0 standard drinks  . Types: 7 Cans of beer per week     No Known Allergies  Current Facility-Administered Medications  Medication Dose Route Frequency Provider Last Rate Last Dose  . cefUROXime (ZINACEF) 1.5 g in sodium chloride 0.9 % 100 mL IVPB  1.5 g Intravenous To OR Grace Isaac, MD      . cefUROXime (ZINACEF) 750 mg in sodium chloride 0.9 % 100 mL IVPB  750 mg Intravenous To OR Grace Isaac, MD      . chlorhexidine (HIBICLENS) 4 % liquid 2 application  30 mL Topical UD Grace Isaac, MD      . dexmedetomidine (PRECEDEX) 400 MCG/100ML (4 mcg/mL) infusion  0.1-0.7 mcg/kg/hr Intravenous To OR Grace Isaac, MD      . DOPamine (INTROPIN) 800 mg in dextrose 5 % 250 mL (3.2 mg/mL) infusion  0-10 mcg/kg/min Intravenous To OR Grace Isaac, MD      . EPINEPHrine (ADRENALIN) 4 mg in dextrose 5 % 250 mL (0.016 mg/mL) infusion  0-10 mcg/min Intravenous To OR Grace Isaac, MD      . heparin 2,500 Units, papaverine 30 mg in electrolyte-148 (PLASMALYTE-148) 500 mL irrigation   Irrigation To OR Grace Isaac, MD      . heparin 30,000 units/NS 1000 mL solution for CELLSAVER   Other To OR Grace Isaac, MD      . insulin regular, human (MYXREDLIN) 100 units/ 100  mL infusion   Intravenous To OR Grace Isaac, MD      . magnesium sulfate (IV Push/IM) injection 40 mEq  40 mEq Other To OR Servando Snare,  Lilia Argue, MD      . milrinone (PRIMACOR) 20 MG/100 ML (0.2 mg/mL) infusion  0.3 mcg/kg/min Intravenous To OR Grace Isaac, MD      . nitroGLYCERIN 50 mg in dextrose 5 % 250 mL (0.2 mg/mL) infusion  2-200 mcg/min Intravenous To OR Grace Isaac, MD      . norepinephrine (LEVOPHED) 4mg  in NS 241mL premix infusion  0-40 mcg/min Intravenous To OR Grace Isaac, MD      . phenylephrine (NEOSYNEPHRINE) 20-0.9 MG/250ML-% infusion  30-200 mcg/min Intravenous To OR Grace Isaac, MD      . potassium chloride injection 80 mEq  80 mEq Other To OR Grace Isaac, MD      . tranexamic acid (CYKLOKAPRON) 2,500 mg in sodium chloride 0.9 % 250 mL (10 mg/mL) infusion  1.5 mg/kg/hr Intravenous To OR Grace Isaac, MD      . tranexamic acid (CYKLOKAPRON) bolus via infusion - over 30 minutes 1,345.5 mg  15 mg/kg Intravenous To OR Grace Isaac, MD      . tranexamic acid (CYKLOKAPRON) pump prime solution 179 mg  2 mg/kg Intracatheter To OR Grace Isaac, MD      . vancomycin (VANCOCIN) 1,500 mg in sodium chloride 0.9 % 250 mL IVPB  1,500 mg Intravenous To OR Grace Isaac, MD        Pertinent items are noted in HPI.   Review of Systems:     Cardiac Review of Systems: [Y] = yes  or   [ N ] = no   Chest Pain [ n   ]  Resting SOB [n   ] Exertional SOB  [n  ]  Orthopnea Florencio.Farrier  ]   Pedal Edema [ n  ]    Palpitations [n  ] Syncope  Florencio.Farrier ]   Presyncope [ n  ]   General Review of Systems: [Y] = yes [  ]=no Constitional: recent weight change [  ];  Wt loss over the last 3 months [   ] anorexia [  ]; fatigue [  ]; nausea [  ]; night sweats [  ]; fever [  ]; or chills [  ];           Eye : blurred vision [  ]; diplopia [   ]; vision changes [  ];  Amaurosis fugax[  ]; Resp: cough [  ];  wheezing[  ];  hemoptysis[  ]; shortness of breath[  ];  paroxysmal nocturnal dyspnea[  ]; dyspnea on exertion[  ]; or orthopnea[  ];  GI:  gallstones[  ], vomiting[  ];  dysphagia[  ]; melena[  ];  hematochezia [  ]; heartburn[  ];   Hx of  Colonoscopy[  ]; GU: kidney stones [  ]; hematuria[  ];   dysuria [  ];  nocturia[  ];  history of     obstruction [  ]; urinary frequency [  ]             Skin: rash, swelling[  ];, hair loss[  ];  peripheral edema[  ];  or itching[  ]; Musculosketetal: myalgias[  ];  joint swelling[  ];  joint erythema[  ];  joint pain[  ];  back pain[  ];  Heme/Lymph: bruising[  ];  bleeding[  ];  anemia[  ];  Neuro: TIA[  ];  headaches[  ];  stroke[  ];  vertigo[  ];  seizures[  ];  paresthesias[  ];  difficulty walking[  ];  Psych:depression[  ]; anxiety[  ];  Endocrine: diabetes[  ];  thyroid dysfunction[  ];  Immunizations: Flu up to date Blue.Reese  ]; Pneumococcal up to date [ y ];  Other:     PHYSICAL EXAMINATION: BP (!) 158/77   Pulse 72   Temp 97.6 F (36.4 C) (Oral)   Resp 18   Ht 5\' 11"  (1.803 m)   Wt 89.4 kg   SpO2 95%   BMI 27.48 kg/m  General appearance: alert, cooperative, appears stated age and no distress Head: Normocephalic, without obvious abnormality, atraumatic Neck: no adenopathy, no carotid bruit, no JVD, supple, symmetrical, trachea midline and thyroid not enlarged, symmetric, no tenderness/mass/nodules Lymph nodes: Cervical, supraclavicular, and axillary nodes normal. Resp: clear to auscultation bilaterally Cardio: regular rate and rhythm, S1, S2 normal, no murmur, click, rub or gallop GI: soft, non-tender; bowel sounds normal; no masses,  no organomegaly Extremities: extremities normal, atraumatic, no cyanosis or edema and Homans sign is negative, no sign of DVT Neurologic: Grossly normal palpable DP and PT pulses bilaterally, appears to have adequate vein and lower extremities for bypass, some bruising left forearm from IV site, less bruising at the right radial cath site  Diagnostic Studies  & Laboratory data:     Recent Radiology Findings:   Dg Chest 2 View  Result Date: 03/10/2018 CLINICAL DATA:  Coronary artery disease with upcoming coronary bypass grafting, preoperative testing EXAM: CHEST - 2 VIEW COMPARISON:  None. FINDINGS: Cardiac shadow is at the upper limits of normal in size. The lungs are clear bilaterally. Minimal left basilar atelectasis is noted. No acute bony abnormality is noted. IMPRESSION: Mild left basilar atelectasis. Electronically Signed   By: Inez Catalina M.D.   On: 03/10/2018 15:43   Ct Coronary Morph W/cta Cor W/score W/ca W/cm &/or Wo/cm  Addendum Date: 02/14/2018   ADDENDUM REPORT: 02/14/2018 19:38 HISTORY: abnormal echo/elevated Ca score EXAM: Cardiac/Coronary  CT TECHNIQUE: The patient was scanned on a [employer]. PROTOCOL: A 120 kV prospective scan was triggered in the descending thoracic aorta at 111 HU's. Axial non-contrast 3 mm slices were carried out through the heart. The data set was analyzed on a dedicated work station and scored using the [place]. Gantry rotation speed was 250 msecs and collimation was .6 mm. No beta blockade and 0.8 mg of sl NTG was given. The 3D data set was reconstructed in 5% intervals of the 67-82 % of the R-R cycle. Diastolic phases were analyzed on a dedicated work station using MPR, MIP and VRT modes. The patient received 80 cc of contrast. FINDINGS: Coronary calcium score: The patient's coronary artery calcium score is 877, which places the patient in the 92nd percentile. This is greater than expected for age and gender matched peers. Coronary arteries: Normal coronary origins.  Right dominance. Right Coronary Artery: The ostial RCA is patent. The proximal and mid RCA are non-diagnostic due to motion artifact. Distal RCA is small caliber and patent. Patent PDA, PL. Left Main Coronary Artery: 6.5 mm caliber, short left main. Minimal distal mixed atherosclerotic plaque, <25% stenosis. Left Anterior Descending  Coronary Artery: Mild atherosclerotic plaque in proximal LAD 25-49% stenosis. Moderate mixed atherosclerotic plaque in the mid LAD, 50-69% stenosis. Two, possible severe calcified plaques in the mid LAD, one with circumferential calcification, 70-99% stenosis. The LAD then takes a brief intramyocardial course, with superficial myocardial bridging. Distal LAD is small caliber and patent. Ramus intermedius: Proximal likely severe  stenosis (motion artifact limits exact assessment), 70-99% stenosis. Left Circumflex Artery: Mild atherosclerotic plaque in the proximal circumflex. Moderate narrowing at the ostium of OM2. Aorta:  Normal size.  No calcifications.  No dissection. Aortic Valve: No calcifications. Other findings: Normal pulmonary vein drainage into the left atrium. Normal left atrial appendage without a thrombus. Normal size of the pulmonary artery. LVEDD 63mm. Left atrial enlargement. IMPRESSION: 1. The patient's coronary artery calcium score is 877, which places the patient in the 92nd percentile. This is greater than expected for age and gender matched peers. 2. Normal coronary origin with right dominance. 3. Severe CAD, CADRADS = 4. Possible severe stenosis in the mid LAD, proximal ramus intermedius. Nondiagnostic images of the RCA due to motion artifact. CT FFR analysis will be sent. Electronically Signed   By: Cherlynn Kaiser   On: 02/14/2018 19:38   Result Date: 02/14/2018 EXAM: OVER-READ INTERPRETATION  CT CHEST The following report is an over-read performed by radiologist Dr. Rolm Baptise of [facility] on 02/14/2018. This over-read does not include interpretation of cardiac or coronary anatomy or pathology. The coronary CTA interpretation by the cardiologist is attached. COMPARISON:  01/03/2018 FINDINGS: Vascular: Mild cardiomegaly.  Aorta is normal caliber. Mediastinum/Nodes: No adenopathy in the lower mediastinum or hila. Lungs/Pleura: No confluent opacities or effusions. Upper  Abdomen: Imaging into the upper abdomen shows no acute findings. Musculoskeletal: Chest wall soft tissues are unremarkable. No acute bony abnormality. IMPRESSION: No acute extra cardiac abnormality. Mild cardiomegaly. Electronically Signed: By: Rolm Baptise M.D. On: 02/14/2018 15:32   Ct Coronary Fractional Flow Reserve Fluid Analysis  Result Date: 02/16/2018 EXAM: CT FFR ANALYSIS CLINICAL DATA:  abnormal echo/elevated Ca score FINDINGS: FFRct analysis was performed on the original cardiac CT angiogram dataset. Diagrammatic representation of the FFRct analysis is provided in a separate PDF document in PACS. This dictation was created using the PDF document and an interactive 3D model of the results. 3D model is not available in the EMR/PACS. Normal FFR range is >0.80. 1. Left Main:  No significant stenosis. FFR = 0.99 2. LAD: No significant stenosis. Proximal FFR = 0.89, Mid FFR = <0.50, Diagonal that branches at the mid LAD = <0.50, distal FFR = <0.50 3. LCX: No significant stenosis. Proximal FFR = 0.90, Distal FFR = not mapped, Proximal OM = 0.84, distal OM = 0.75 4. RCA: Could not be mapped due to motion artifact. 5. Ramus intermedius vs. High diagonal: Proximal FFR = 0.9, Distal FFR = 0.86 IMPRESSION: 1. CT FFR analysis shows severe stenosis in the mid LAD and the diagonal branch that arises adjacent to the mid LAD (likely D3). This appears to be a potential bifurcation lesion. Electronically Signed   By: Cherlynn Kaiser   On: 02/16/2018 18:35   Vas US Doppler Pre Cabg  Result Date: 03/10/2018 PREOPERATIVE VASCULAR EVALUATION  Indications: Pre-CABG. Performing Technologist: Carlos Levering RVT  Examination Guidelines: A complete evaluation includes B-mode imaging, spectral Doppler, color Doppler, and power Doppler as needed of all accessible portions of each vessel. Bilateral testing is considered an integral part of a complete examination. Limited examinations for reoccurring indications may be  performed as noted.  Right Carotid Findings: +----------+--------+--------+--------+-----------------------+--------+           PSV cm/sEDV cm/sStenosisDescribe               Comments +----------+--------+--------+--------+-----------------------+--------+ CCA Prox  121     18              smooth  and heterogenous         +----------+--------+--------+--------+-----------------------+--------+ CCA Distal104     16              smooth and heterogenous         +----------+--------+--------+--------+-----------------------+--------+ ICA Prox  119     21              smooth and heterogenous         +----------+--------+--------+--------+-----------------------+--------+ ICA Distal96      33                                              +----------+--------+--------+--------+-----------------------+--------+ ECA       117     14                                              +----------+--------+--------+--------+-----------------------+--------+ Portions of this table do not appear on this page. +----------+--------+-------+--------+------------+           PSV cm/sEDV cmsDescribeArm Pressure +----------+--------+-------+--------+------------+ Subclavian96                     135          +----------+--------+-------+--------+------------+ +---------+--------+--+--------+--+---------+ VertebralPSV cm/s44EDV cm/s12Antegrade +---------+--------+--+--------+--+---------+ Left Carotid Findings: +----------+--------+--------+--------+-----------------------+--------+           PSV cm/sEDV cm/sStenosisDescribe               Comments +----------+--------+--------+--------+-----------------------+--------+ CCA Prox  93      23              smooth and heterogenous         +----------+--------+--------+--------+-----------------------+--------+ CCA Distal66      22                                               +----------+--------+--------+--------+-----------------------+--------+ ICA Prox  65      25                                              +----------+--------+--------+--------+-----------------------+--------+ ICA Distal100     34                                     tortuous +----------+--------+--------+--------+-----------------------+--------+ ECA       58      14                                              +----------+--------+--------+--------+-----------------------+--------+ +----------+--------+--------+--------+------------+ SubclavianPSV cm/sEDV cm/sDescribeArm Pressure +----------+--------+--------+--------+------------+           94                      154          +----------+--------+--------+--------+------------+ +---------+--------+--+--------+--+---------+ VertebralPSV cm/s61EDV cm/s23Antegrade +---------+--------+--+--------+--+---------+  ABI Findings: +--------+------------------+-----+---------+--------+ Right   Rt Pressure (  mmHg)IndexWaveform Comment  +--------+------------------+-----+---------+--------+ [id number]                    triphasic         +--------+------------------+-----+---------+--------+ PTA     204               1.32 triphasic         +--------+------------------+-----+---------+--------+ DP      202               1.31 triphasic         +--------+------------------+-----+---------+--------+ +--------+------------------+-----+---------+-------+ Left    Lt Pressure (mmHg)IndexWaveform Comment +--------+------------------+-----+---------+-------+ [id number]                    triphasic        +--------+------------------+-----+---------+-------+ PTA     198               1.29 triphasic        +--------+------------------+-----+---------+-------+ DP      206               1.34 triphasic        +--------+------------------+-----+---------+-------+  +-------+---------------+----------------+ ABI/TBIToday's ABI/TBIPrevious ABI/TBI +-------+---------------+----------------+ Right  1.32                            +-------+---------------+----------------+ Left   1.34                            +-------+---------------+----------------+  Right Doppler Findings: +-----------+--------+-----+---------+-----------------------------------------+ Site       PressureIndexDoppler  Comments                                  +-----------+--------+-----+---------+-----------------------------------------+ Brachial   135          triphasic                                          +-----------+--------+-----+---------+-----------------------------------------+ Radial                  triphasic                                          +-----------+--------+-----+---------+-----------------------------------------+ Ulnar                   triphasic                                          +-----------+--------+-----+---------+-----------------------------------------+ Palmar Arch                      Palmar waveforms remain within normal                                      limits with radial compression and are  obliterated with ulnar compression.       +-----------+--------+-----+---------+-----------------------------------------+  Left Doppler Findings: +-----------+--------+-----+---------+-----------------------------------------+ Site       PressureIndexDoppler  Comments                                  +-----------+--------+-----+---------+-----------------------------------------+ Brachial   154          triphasic                                          +-----------+--------+-----+---------+-----------------------------------------+ Radial                  triphasic                                           +-----------+--------+-----+---------+-----------------------------------------+ Ulnar                   triphasic                                          +-----------+--------+-----+---------+-----------------------------------------+ Palmar Arch                      Palmar waveforms are diminished greater                                    than fifty percent with radial                                             compression and remain within normal                                       limits with ulnar compression.            +-----------+--------+-----+---------+-----------------------------------------+  Summary: Right Carotid: Velocities in the right ICA are consistent with a 1-39% stenosis. Left Carotid: Velocities in the left ICA are consistent with a 1-39% stenosis. Vertebrals: Bilateral vertebral arteries demonstrate antegrade flow. Right ABI: Resting right ankle-brachial index indicates noncompressible right lower extremity arteries. Left ABI: Resting left ankle-brachial index indicates noncompressible left lower extremity arteries.  Electronically signed by Ruta Hinds MD on 03/10/2018 at 5:26:15 PM.    Final      I have independently reviewed the above radiology studies  and reviewed the findings with the patient.   Recent Lab Findings: Lab Results  Component Value Date   WBC 7.1 03/10/2018   HGB 15.2 03/10/2018   HCT 45.9 03/10/2018   PLT 207 03/10/2018   GLUCOSE 122 (H) 03/10/2018   ALT 31 03/10/2018   AST 31 03/10/2018   NA 138 03/10/2018   K 3.3 (L) 03/10/2018   CL 102 03/10/2018   CREATININE 1.20 03/10/2018   BUN 11 03/10/2018   CO2 25 03/10/2018   INR 1.04 03/10/2018   HGBA1C 5.8 (H) 03/10/2018    CATH:  Prox RCA lesion is 100% stenosed.  Ramus-1 lesion is 80% stenosed.  Ramus-2 lesion is 90% stenosed.  Ost 1st Diag lesion is 75% stenosed.  Prox LAD to Mid LAD lesion is 75% stenosed.  The left ventricular systolic function is  normal.  LV end diastolic pressure is normal.  The left ventricular ejection fraction is 50-55% by visual estimate.  There is no aortic valve stenosis.   Severe multivessel, calcific CAD.   Plan for cardiac surgery consult as an outpatient.   Diagnostic  Dominance: Right  Left Anterior Descending  Prox LAD lesion 40% stenosed  Prox LAD lesion is 40% stenosed. The lesion is calcified.  Prox LAD to Mid LAD lesion 75% stenosed  Prox LAD to Mid LAD lesion is 75% stenosed. The lesion is moderately calcified.  First Diagonal Branch  Ost 1st Diag lesion 75% stenosed  Ost 1st Diag lesion is 75% stenosed.  Ramus Intermedius  Ramus-1 lesion 80% stenosed  Ramus-1 lesion is 80% stenosed.  Ramus-2 lesion 90% stenosed  Ramus-2 lesion is 90% stenosed.  Left Circumflex  The vessel exhibits minimal luminal irregularities.  Right Coronary Artery  Prox RCA lesion 100% stenosed  Prox RCA lesion is 100% stenosed.  Right Posterior Descending Artery  Collaterals  RPDA filled by collaterals from 2nd Sept.     AO Systolic Pressure 735 mmHg  AO Diastolic Pressure 81 mmHg  AO Mean 329 mmHg  LV Systolic Pressure 924 mmHg  LV Diastolic Pressure 3 mmHg  LV EDP 6 mmHg  AOp Systolic Pressure 268 mmHg  AOp Diastolic Pressure 80 mmHg  AOp Mean Pressure 341 mmHg  LVp Systolic Pressure 962 mmHg  LVp Diastolic Pressure 2 mmHg  LVp EDP Pressure 5 mmHg        I have independently reviewed the above  cath films and reviewed the findings with the  patient .   Assessment / Plan:   #1 patient with total occlusion of the right coronary artery with collaterals from the LAD, and significant proximal and mid LAD disease, positive CT FFR in the LAD distribution. -After extensive review of the patient's cath films and CT and discussion with his cardiologist Dr. Irish Lack, I have recommended to the patient that we proceed with coronary artery bypass grafting, particularly with the goal of placement of left  internal mammary artery to the left anterior descending coronary artery.  Patient is agreeable with proceeding with surgery, options of angioplasty and stenting LAD versus medical treatment have also been reviewed with him.  He is agreeable with proceeding.  Plavix has been held.   The goals risks and alternatives of the planned surgical procedure CABG  have been discussed with the patient in detail. The risks of the procedure including death, infection, stroke, myocardial infarction, bleeding, blood transfusion, artial fib  have all been discussed specifically.  I have quoted Kerin Salen a 1.5  % of perioperative mortality and a complication rate as high as 30 %. The patient's questions have been answered.CHARLI LIBERATORE is willing  to proceed with the planned procedure.  Grace Isaac MD      [address] [place] [address] Office [phone]   Beeper [phone]  03/12/2018 7:03 AM

## 2018-03-12 NOTE — Brief Op Note (Signed)
03/12/2018  7:26 AM  PATIENT:  Jeffery Hunter  63 y.o. male  PRE-OPERATIVE DIAGNOSIS:  CAD  POST-OPERATIVE DIAGNOSIS:  CAD  PROCEDURE:  Procedure(s): CORONARY ARTERY BYPASS GRAFTING (CABG) x four , using left internal mammary artery to LAD and sequential vein graft  to intermediate and diagonal and vein graft to posterior descending.   right thigh  greater saphenous endovein harvest (N/A) TRANSESOPHAGEAL ECHOCARDIOGRAM (TEE) (N/A)  SURGEON:  Surgeon(s) and Role:    * Grace Isaac, MD - Primary  PHYSICIAN ASSISTANT: WAYNE GOLD PA-C  ANESTHESIA:   general  EBL:  650 mL   BLOOD ADMINISTERED:none  DRAINS: ROUTINE PLEURAL AND PERICARDIAL CHEST TUBES   LOCAL MEDICATIONS USED:  NONE  SPECIMEN:  No Specimen  DISPOSITION OF SPECIMEN:  N/A  COUNTS:  YES  TOURNIQUET:  * No tourniquets in log *  DICTATION: .Other Dictation: Dictation Number PENDING  PLAN OF CARE: Admit to inpatient   PATIENT DISPOSITION:  ICU - intubated and hemodynamically stable.   Delay start of Pharmacological VTE agent (>24hrs) due to surgical blood loss or risk of bleeding: yes  COMPLICATIONS: NO KNOWN

## 2018-03-12 NOTE — Progress Notes (Signed)
Pharmacy tech unavailable to update meds/allergies. Called several times, no answer. Allergy and medication put in epic.

## 2018-03-13 ENCOUNTER — Inpatient Hospital Stay (HOSPITAL_COMMUNITY): Payer: No Typology Code available for payment source

## 2018-03-13 ENCOUNTER — Encounter (HOSPITAL_COMMUNITY): Payer: Self-pay | Admitting: Cardiothoracic Surgery

## 2018-03-13 LAB — BASIC METABOLIC PANEL
ANION GAP: 8 (ref 5–15)
Anion gap: 5 (ref 5–15)
BUN: 10 mg/dL (ref 8–23)
BUN: 10 mg/dL (ref 8–23)
CO2: 23 mmol/L (ref 22–32)
CO2: 24 mmol/L (ref 22–32)
Calcium: 7.9 mg/dL — ABNORMAL LOW (ref 8.9–10.3)
Calcium: 8.3 mg/dL — ABNORMAL LOW (ref 8.9–10.3)
Chloride: 108 mmol/L (ref 98–111)
Chloride: 103 mmol/L (ref 98–111)
Creatinine, Ser: 0.84 mg/dL (ref 0.61–1.24)
Creatinine, Ser: 1.01 mg/dL (ref 0.61–1.24)
GFR calc Af Amer: >60 mL/min (ref >60)
GFR calc Af Amer: >60 mL/min (ref >60)
GFR calc non Af Amer: >60 mL/min (ref >60)
GFR calc non Af Amer: >60 mL/min (ref >60)
Glucose, Bld: 134 mg/dL — ABNORMAL HIGH (ref 70–99)
Glucose, Bld: 137 mg/dL — ABNORMAL HIGH (ref 70–99)
POTASSIUM: 4 mmol/L (ref 3.5–5.1)
Potassium: 3.4 mmol/L — ABNORMAL LOW (ref 3.5–5.1)
Sodium: 136 mmol/L (ref 135–145)
Sodium: 135 mmol/L (ref 135–145)

## 2018-03-13 LAB — POCT I-STAT 7, (LYTES, BLD GAS, ICA,H+H)
Acid-base deficit: 3 mmol/L — ABNORMAL HIGH (ref 0.0–2.0)
Bicarbonate: 22.3 mmol/L (ref 20.0–28.0)
Calcium, Ion: 1.11 mmol/L — ABNORMAL LOW (ref 1.15–1.40)
HCT: 32 % — ABNORMAL LOW (ref 39.0–52.0)
Hemoglobin: 10.9 g/dL — ABNORMAL LOW (ref 13.0–17.0)
O2 Saturation: 92 %
Patient temperature: 37.3
Potassium: 4.2 mmol/L (ref 3.5–5.1)
Sodium: 141 mmol/L (ref 135–145)
TCO2: 23 mmol/L (ref 22–32)
pCO2 arterial: 38.4 mmHg (ref 32.0–48.0)
pH, Arterial: 7.373 (ref 7.350–7.450)
pO2, Arterial: 65 mmHg — ABNORMAL LOW (ref 83.0–108.0)

## 2018-03-13 LAB — CBC
HCT: 36.5 % — ABNORMAL LOW (ref 39.0–52.0)
HCT: 35.2 % — ABNORMAL LOW (ref 39.0–52.0)
Hemoglobin: 12.3 g/dL — ABNORMAL LOW (ref 13.0–17.0)
Hemoglobin: 12 g/dL — ABNORMAL LOW (ref 13.0–17.0)
MCH: 28.6 pg (ref 26.0–34.0)
MCH: 29.3 pg (ref 26.0–34.0)
MCHC: 33.7 g/dL (ref 30.0–36.0)
MCHC: 34.1 g/dL (ref 30.0–36.0)
MCV: 84.9 fL (ref 80.0–100.0)
MCV: 86.1 fL (ref 80.0–100.0)
Platelets: 144 10*3/uL — ABNORMAL LOW (ref 150–400)
Platelets: 139 10*3/uL — ABNORMAL LOW (ref 150–400)
RBC: 4.3 MIL/uL (ref 4.22–5.81)
RBC: 4.09 MIL/uL — ABNORMAL LOW (ref 4.22–5.81)
RDW: 11.9 % (ref 11.5–15.5)
RDW: 11.9 % (ref 11.5–15.5)
WBC: 10 10*3/uL (ref 4.0–10.5)
WBC: 9.4 10*3/uL (ref 4.0–10.5)
nRBC: 0 % (ref 0.0–0.2)
nRBC: 0 % (ref 0.0–0.2)

## 2018-03-13 LAB — GLUCOSE, CAPILLARY
Glucose-Capillary: 124 mg/dL — ABNORMAL HIGH (ref 70–99)
Glucose-Capillary: 106 mg/dL — ABNORMAL HIGH (ref 70–99)
Glucose-Capillary: 112 mg/dL — ABNORMAL HIGH (ref 70–99)
Glucose-Capillary: 125 mg/dL — ABNORMAL HIGH (ref 70–99)
Glucose-Capillary: 107 mg/dL — ABNORMAL HIGH (ref 70–99)
Glucose-Capillary: 117 mg/dL — ABNORMAL HIGH (ref 70–99)

## 2018-03-13 LAB — MAGNESIUM
Magnesium: 2.5 mg/dL — ABNORMAL HIGH (ref 1.7–2.4)
Magnesium: 2.3 mg/dL (ref 1.7–2.4)

## 2018-03-13 MED ORDER — POTASSIUM CHLORIDE CRYS ER 20 MEQ PO TBCR
20.0000 meq | EXTENDED_RELEASE_TABLET | ORAL | Status: AC
  Administered 2018-03-13 – 2018-03-14 (×3): 20 meq via ORAL
  Filled 2018-03-13 (×3): qty 1

## 2018-03-13 MED ORDER — KETOROLAC TROMETHAMINE 15 MG/ML IJ SOLN
15.0000 mg | Freq: Three times a day (TID) | INTRAMUSCULAR | Status: AC | PRN
  Administered 2018-03-13 – 2018-03-14 (×3): 15 mg via INTRAVENOUS
  Filled 2018-03-13 (×4): qty 1

## 2018-03-13 MED ORDER — POTASSIUM CHLORIDE 10 MEQ/50ML IV SOLN
10.0000 meq | INTRAVENOUS | Status: AC
  Administered 2018-03-13 (×2): 10 meq via INTRAVENOUS
  Filled 2018-03-13 (×2): qty 50

## 2018-03-13 MED ORDER — ENOXAPARIN SODIUM 40 MG/0.4ML ~~LOC~~ SOLN
40.0000 mg | Freq: Every day | SUBCUTANEOUS | Status: DC
  Administered 2018-03-13 – 2018-03-15 (×3): 40 mg via SUBCUTANEOUS
  Filled 2018-03-13 (×3): qty 0.4

## 2018-03-13 MED ORDER — INSULIN ASPART 100 UNIT/ML ~~LOC~~ SOLN
0.0000 [IU] | SUBCUTANEOUS | Status: DC
  Administered 2018-03-13: 2 [IU] via SUBCUTANEOUS

## 2018-03-13 MED FILL — Magnesium Sulfate Inj 50%: INTRAMUSCULAR | Qty: 10 | Status: AC

## 2018-03-13 MED FILL — Potassium Chloride Inj 2 mEq/ML: INTRAVENOUS | Qty: 40 | Status: AC

## 2018-03-13 MED FILL — Heparin Sodium (Porcine) Inj 1000 Unit/ML: INTRAMUSCULAR | Qty: 30 | Status: AC

## 2018-03-13 NOTE — Progress Notes (Signed)
Dr. Servando Snare at bedside and gave verbal orders for day-shift to remove A-line & Jeffery Hunter. He reported he will be back later today to give orders to d/c chest tubes. Will pass info on to day-shift RN. Pt stable & resting at this time.

## 2018-03-13 NOTE — Progress Notes (Signed)
      [address]       [place] [address]             [phone]      POD # 1 CABG x 4  Doing well. Has been ambulating  BP 136/78   Pulse 68   Temp 98 F (36.7 C) (Oral)   Resp (!) 26   Ht 5\' 11"  (1.803 m)   Wt 88.7 kg   SpO2 95%   BMI 27.27 kg/m   Intake/Output Summary (Last 24 hours) at 03/13/2018 1901 Last data filed at 03/13/2018 1839 Gross per 24 hour  Intake 2696.37 ml  Output 2490 ml  Net 206.37 ml   K= 3.4- supplement  Looks great  [place]land C. Roxan Hockey, MD [facility] [phone]

## 2018-03-13 NOTE — Progress Notes (Addendum)
[facility] DAILY ICU PROGRESS NOTE                   Jeffery Hunter.[address]            [place] [address]          [phone]   1 Day Post-Op Procedure(s) (LRB): CORONARY ARTERY BYPASS GRAFTING (CABG) x four , using left internal mammary artery to LAD and sequential vein graft  to intermediate and diagonal and vein graft to posterior descending.   right thigh  greater saphenous endovein harvest (N/A) TRANSESOPHAGEAL ECHOCARDIOGRAM (TEE) (N/A)  Total Length of Stay:  LOS: 1 day   Subjective: Minimal pain  Objective: Vital signs in last 24 hours: Temp:  [96.4 F (35.8 C)-99.5 F (37.5 C)] 97.5 F (36.4 C) (01/23 0700) Pulse Rate:  [72-89] 72 (01/23 0700) Cardiac Rhythm: Normal sinus rhythm (01/23 0400) Resp:  [12-30] 18 (01/23 0700) BP: (96-151)/(61-88) 128/73 (01/23 0700) SpO2:  [95 %-100 %] 97 % (01/23 0700) Arterial Line BP: (97-159)/(58-81) 142/72 (01/23 0700) FiO2 (%):  [40 %-100 %] 40 % (01/22 1500) Weight:  [88.7 kg] 88.7 kg (01/23 0500)  Filed Weights   03/12/18 0558 03/13/18 0500  Weight: 89.4 kg 88.7 kg    Weight change: -0.659 kg   Hemodynamic parameters for last 24 hours: PAP: (18-36)/(7-18) 30/15 CO:  [4.4 L/min-7.8 L/min] 6.1 L/min CI:  [2.1 L/min/m2-3.7 L/min/m2] 2.9 L/min/m2  Intake/Output from previous day: 01/22 0701 - 01/23 0700 In: 4774.9 [I.V.:3070.1; Blood:560; IV Piggyback:1144.8] Out: 7416 [Urine:3340; Blood:650; Chest Tube:330]  Intake/Output this shift: No intake/output data recorded.  Current Meds: Scheduled Meds: . acetaminophen  1,000 mg Oral Q6H   Or  . acetaminophen (TYLENOL) oral liquid 160 mg/5 mL  1,000 mg Per Tube Q6H  . aspirin EC  325 mg Oral Daily   Or  . aspirin  324 mg Per Tube Daily  . bisacodyl  10 mg Oral Daily   Or  . bisacodyl  10 mg Rectal Daily  . Chlorhexidine Gluconate Cloth  6 each Topical Daily  . docusate sodium  200 mg Oral Daily  . insulin aspart  0-24 Units Subcutaneous Q4H  . metoprolol  tartrate  12.5 mg Oral BID   Or  . metoprolol tartrate  12.5 mg Per Tube BID  . mupirocin ointment  1 application Nasal BID  . [START ON 03/14/2018] pantoprazole  40 mg Oral Daily  . rosuvastatin  20 mg Oral Daily  . sodium chloride flush  10-40 mL Intracatheter Q12H  . sodium chloride flush  3 mL Intravenous Q12H   Continuous Infusions: . sodium chloride Stopped (03/12/18 1954)  . sodium chloride    . sodium chloride 10 mL/hr at 03/12/18 1325  . albumin human 12.5 g (03/12/18 1541)  . cefUROXime (ZINACEF)  IV Stopped (03/12/18 2024)  . dexmedetomidine (PRECEDEX) IV infusion Stopped (03/12/18 1522)  . lactated ringers    . lactated ringers    . lactated ringers 20 mL/hr at 03/13/18 0700  . nitroGLYCERIN    . phenylephrine (NEO-SYNEPHRINE) Adult infusion Stopped (03/12/18 1459)   PRN Meds:.sodium chloride, albumin human, lactated ringers, metoprolol tartrate, midazolam, morphine injection, ondansetron (ZOFRAN) IV, oxyCODONE, sodium chloride flush, sodium chloride flush, traMADol  General appearance: alert, cooperative and no distress Heart: regular rate and rhythm and + rub with CT's in place Lungs: clear to auscultation bilaterally and min dim in bases Abdomen: benign Extremities: no edema Wound: dressings CDI  Lab Results: CBC:  Recent Labs    03/12/18 1853 03/13/18 0320  WBC 11.1* 10.0  HGB 12.8* 12.3*  HCT 37.5* 36.5*  PLT 138* 144*   BMET:  Recent Labs    03/12/18 1853 03/13/18 0320  NA 139 136  K 3.8 4.0  CL 110 108  CO2 22 23  GLUCOSE 134* 134*  BUN 9 10  CREATININE 0.88 0.84  CALCIUM 7.6* 7.9*    CMET: Lab Results  Component Value Date   WBC 10.0 03/13/2018   HGB 12.3 (L) 03/13/2018   HCT 36.5 (L) 03/13/2018   PLT 144 (L) 03/13/2018   GLUCOSE 134 (H) 03/13/2018   ALT 31 03/10/2018   AST 31 03/10/2018   NA 136 03/13/2018   K 4.0 03/13/2018   CL 108 03/13/2018   CREATININE 0.84 03/13/2018   BUN 10 03/13/2018   CO2 23 03/13/2018   INR 1.41  03/12/2018   HGBA1C 5.8 (H) 03/10/2018      PT/INR:  Recent Labs    03/12/18 1319  LABPROT 17.1*  INR 1.41   Radiology: Dg Chest Port 1 View  Result Date: 03/12/2018 CLINICAL DATA:  Status post CABG. EXAM: PORTABLE CHEST 1 VIEW COMPARISON:  03/10/2018 FINDINGS: Swan-Ganz catheter tip projects over the right ventricular outflow tract. ET tube tip is above the carina. Enteric tube tip is below the GE junction. Mediastinal drain and left chest tube in place. No significant pneumothorax identified. Heart size appears normal. There is subsegmental atelectasis in the left midlung and left base. No pulmonary edema. IMPRESSION: 1. Support apparatus positioned as above. 2. Left chest tube in place without pneumothorax. 3. Left midlung and left base subsegmental atelectasis. Electronically Signed   By: Kerby Moors M.D.   On: 03/12/2018 13:54     Assessment/Plan: S/P Procedure(s) (LRB): CORONARY ARTERY BYPASS GRAFTING (CABG) x four , using left internal mammary artery to LAD and sequential vein graft  to intermediate and diagonal and vein graft to posterior descending.   right thigh  greater saphenous endovein harvest (N/A) TRANSESOPHAGEAL ECHOCARDIOGRAM (TEE) (N/A)  1 doing well POD#1 2 hemodyn stable in sinus brady rhythm, paced ,has  sinus brady by history. Some HTN @times - monitor closely, d/c aline and swan 3 excellent diuresis , does not appear volume overloaded- observe clinically 4 CT not much drainage post op- d/c tubes 5 ekg - old inf infarct c/w scarring seen at surgery 6 normal renal fxn 7 very minor ABL anemia- monitor clinically 8 BS controlled- nutritional management, HgA1C 5.8  9 routine rehab/pulm toilet See POD#1 ordders   John Giovanni PA-C 03/13/2018 7:26 AM   Pager [phone]   Awake, alert, good pain control D/c lines and tubes  Expected Acute  Blood - loss Anemia- continue to monitor  I have seen and examined Kerin Salen and agree with the above  assessment  and plan.  Grace Isaac MD Beeper [phone] Office [phone] 03/13/2018 8:20 AM

## 2018-03-13 NOTE — Anesthesia Postprocedure Evaluation (Signed)
Anesthesia Post Note  Patient: Jeffery Hunter  Procedure(s) Performed: CORONARY ARTERY BYPASS GRAFTING (CABG) x four , using left internal mammary artery to LAD and sequential vein graft  to intermediate and diagonal and vein graft to posterior descending.   right thigh  greater saphenous endovein harvest (N/A Chest) TRANSESOPHAGEAL ECHOCARDIOGRAM (TEE) (N/A )     Patient location during evaluation: SICU Anesthesia Type: General Level of consciousness: awake and alert Pain management: pain level controlled Vital Signs Assessment: post-procedure vital signs reviewed and stable Respiratory status: spontaneous breathing Cardiovascular status: stable Postop Assessment: no apparent nausea or vomiting Anesthetic complications: no Comments: Minimal pain, no vasopressors. Ambulating. Good spirits.     Last Vitals:  Vitals:   03/13/18 1605 03/13/18 1700  BP: 134/77 128/76  Pulse: 64 71  Resp: (!) 21 20  Temp: 36.7 C   SpO2: 93% 93%    Last Pain:  Vitals:   03/13/18 1605  TempSrc: Oral  PainSc: 0-No pain                 Effie Berkshire

## 2018-03-14 ENCOUNTER — Inpatient Hospital Stay (HOSPITAL_COMMUNITY): Payer: No Typology Code available for payment source

## 2018-03-14 LAB — CBC
HCT: 34.8 % — ABNORMAL LOW (ref 39.0–52.0)
Hemoglobin: 12.2 g/dL — ABNORMAL LOW (ref 13.0–17.0)
MCH: 29.9 pg (ref 26.0–34.0)
MCHC: 35.1 g/dL (ref 30.0–36.0)
MCV: 85.3 fL (ref 80.0–100.0)
NRBC: 0 % (ref 0.0–0.2)
Platelets: 141 10*3/uL — ABNORMAL LOW (ref 150–400)
RBC: 4.08 MIL/uL — ABNORMAL LOW (ref 4.22–5.81)
RDW: 11.9 % (ref 11.5–15.5)
WBC: 9.2 10*3/uL (ref 4.0–10.5)

## 2018-03-14 LAB — BASIC METABOLIC PANEL
Anion gap: 4 — ABNORMAL LOW (ref 5–15)
BUN: 10 mg/dL (ref 8–23)
CO2: 25 mmol/L (ref 22–32)
Calcium: 8.1 mg/dL — ABNORMAL LOW (ref 8.9–10.3)
Chloride: 108 mmol/L (ref 98–111)
Creatinine, Ser: 0.86 mg/dL (ref 0.61–1.24)
GFR calc Af Amer: >60 mL/min (ref >60)
GFR calc non Af Amer: >60 mL/min (ref >60)
Glucose, Bld: 122 mg/dL — ABNORMAL HIGH (ref 70–99)
POTASSIUM: 4.2 mmol/L (ref 3.5–5.1)
Sodium: 137 mmol/L (ref 135–145)

## 2018-03-14 LAB — GLUCOSE, CAPILLARY
Glucose-Capillary: 117 mg/dL — ABNORMAL HIGH (ref 70–99)
Glucose-Capillary: 106 mg/dL — ABNORMAL HIGH (ref 70–99)
Glucose-Capillary: 106 mg/dL — ABNORMAL HIGH (ref 70–99)

## 2018-03-14 MED ORDER — HYDROMORPHONE HCL 2 MG PO TABS
2.0000 mg | ORAL_TABLET | ORAL | Status: DC | PRN
  Administered 2018-03-14 – 2018-03-16 (×3): 2 mg via ORAL
  Filled 2018-03-14 (×3): qty 1

## 2018-03-14 MED ORDER — MOVING RIGHT ALONG BOOK
Freq: Once | Status: DC
  Filled 2018-03-14 (×2): qty 1

## 2018-03-14 MED ORDER — SODIUM CHLORIDE 0.9% FLUSH: 3.0000 mL | INTRAVENOUS | Status: DC | PRN

## 2018-03-14 MED ORDER — SODIUM CHLORIDE 0.9 % IV SOLN: 250.0000 mL | INTRAVENOUS | Status: DC | PRN

## 2018-03-14 MED ORDER — METOPROLOL TARTRATE 25 MG/10 ML ORAL SUSPENSION: 12.5000 mg | Freq: Two times a day (BID) | ORAL | Status: DC

## 2018-03-14 MED ORDER — LOSARTAN POTASSIUM 25 MG PO TABS
25.0000 mg | ORAL_TABLET | Freq: Every day | ORAL | Status: DC
  Administered 2018-03-14 – 2018-03-16 (×3): 25 mg via ORAL
  Filled 2018-03-14 (×3): qty 1

## 2018-03-14 MED ORDER — METOPROLOL TARTRATE 25 MG PO TABS
25.0000 mg | ORAL_TABLET | Freq: Two times a day (BID) | ORAL | Status: DC
  Administered 2018-03-14 – 2018-03-16 (×5): 25 mg via ORAL
  Filled 2018-03-14 (×5): qty 1

## 2018-03-14 MED ORDER — SODIUM CHLORIDE 0.9% FLUSH
3.0000 mL | Freq: Two times a day (BID) | INTRAVENOUS | Status: DC
  Administered 2018-03-14 – 2018-03-15 (×3): 3 mL via INTRAVENOUS

## 2018-03-14 NOTE — Discharge Summary (Signed)
Physician Discharge Summary  Patient ID: Jeffery Hunter MRN: [id number] DOB/AGE: [birth year] 63 y.o.  Admit date: 03/12/2018 Discharge date: 03/16/2018  Admission Diagnoses: Severe coronary artery disease  Discharge Diagnoses:  Active Problems:   S/P CABG x 4   Past Medical History:  Diagnosis Date  . Adenomatous polyp of colon   . BPH with urinary obstruction   . Coronary artery disease   . High frequency hearing loss   . History of kidney stones   . Hx of colonoscopy   . Hyperlipemia   . Impacted cerumen of both ears   . Kidney stones   . Nephrolithiasis   . Tuberculosis    History of present illness:  The patient is a 63 year old male who was referred to Dr. Servando Snare for consideration of coronary artery bypass grafting.  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  He does note some fatigue in the evenings after work but usually still goes to the gym almost daily.  His typical workout he gets a heart rate of 130 without symptoms.  The patient does have a positive family history for coronary artery disease including his father at age 39 having coronary artery bypass grafting and grandfather died at 24 of a myocardial infarction.  Approximately 4 to 5 years ago he had a questionable history of SVT but this has not recurred.  Several years ago it was noted that he had very small Q waves on EKG.  Recently he has undergone a CT scan for calcium score and it was noted to be dramatically elevated at 1100.  This subsequently led to cardiac CT suggesting coronary disease possible high-grade stenosis in the right coronary artery and he was started on Plavix and subsequently underwent cardiac catheterization by Dr. Irish Lack.  At time of cardiac catheterization his right coronary artery was noted to be totally occluded with collateral flow from the LAD and also there was a 75% stenosis of the LAD near the takeoff of the septal.  There was a  90% stenosis in the small intermediate.  The circumflex was a large vessel without significant disease.  FFR calculations were significant for hemodynamic significance for the LAD lesion.  After evaluating the patient and his studies Dr. Servando Snare agreed with recommendations to proceed with CABG and he was admitted this hospitalization for the procedure.  Discharged Condition: good  Hospital Course: The patient was admitted electively and on 03/12/2018 he was taken to the operating room where he underwent the below described procedure.  He tolerated it well and was taken to the surgical intensive care unit in stable condition.  Postoperative hospital course:  The patient has done quite well.  He has had stable hemodynamics.  He initially did have a somewhat slow sinus bradycardia and was placed temporarily on atrial pacing.  His heart rate has improved and he does have some PVCs.  Beta-blocker has been titrated and electrolytes have been monitored closely with replacement of potassium as needed.  He does not have any significant volume overload and has had a good spontaneous diuresis.  He does have a mild expected acute blood loss anemia which is stable.  Renal function has remained within normal limits.  Pain is been adequately controlled initially was Toradol and he is intolerant to some pain medications but does tolerate Dilaudid.  His incisions are noted to be healing well without evidence of infection.  He is tolerating gradually increasing activities using standard cardiac rehab  protocols.  Oxygen has been weaned and he maintains good saturations on room air.  Overall, at the time of discharge the patient is felt to be quite stable.  Consults: None  Significant Diagnostic Studies: Routine postoperative labs and serial chest x-rays.  Treatments: surgery:  OPERATIVE REPORT  DATE OF PROCEDURE:  03/12/2018  PREOPERATIVE DIAGNOSIS:  Coronary occlusive disease.  POSTOPERATIVE DIAGNOSIS:   Coronary occlusive disease.  SURGICAL PROCEDURE:  Coronary artery bypass grafting x4 with the left internal mammary to the left anterior descending coronary artery, sequential reverse saphenous vein graft to the intermediate and diagonal, reverse saphenous vein graft to the  posterior descending with right greater saphenous thigh endoscopic vein harvesting.  SURGEON:  Lanelle Bal, MD  FIRST ASSISTANT:  Jadene Pierini, PA.-C   Discharge Exam: Blood pressure (!) 128/95, pulse 67, temperature 97.9 F (36.6 C), temperature source Oral, resp. rate (!) 21, height 5\' 11"  (1.803 m), weight 89 kg, SpO2 95 %.  General appearance: alert, cooperative and no distress Neurologic: intact Heart: regular rate and rhythm, S1, S2 normal, no murmur, click, rub or gallop Lungs: clear to auscultation bilaterally Abdomen: soft, non-tender; bowel sounds normal; no masses,  no organomegaly Extremities: extremities normal, atraumatic, no cyanosis or edema and Homans sign is negative, no sign of DVT Wound: sternum stable  Discharge disposition: 01-Home or Self Care  Discharge Medications:  Discharge Instructions    Amb Referral to Cardiac Rehabilitation   Complete by:  As directed    Diagnosis:  CABG   CABG X ___:  4     Allergies as of 03/16/2018      Reactions   Oxycodone Nausea And Vomiting   Constipation, problems with voiding      Medication List    STOP taking these medications   clopidogrel 75 MG tablet Commonly known as:  PLAVIX     TAKE these medications   acetaminophen 500 MG tablet Commonly known as:  TYLENOL Take 2 tablets (1,000 mg total) by mouth every 6 (six) hours as needed.   aspirin 325 MG EC tablet Take 1 tablet (325 mg total) by mouth daily. What changed:    medication strength  how much to take   benzonatate 200 MG capsule Commonly known as:  TESSALON Take 200 mg by mouth 2 (two) times daily as needed.   co-enzyme Q-10 30 MG capsule Take 20 mg by mouth  daily.   FLOVENT HFA 110 MCG/ACT inhaler Generic drug:  fluticasone Inhale 2 puffs into the lungs as needed.   losartan 25 MG tablet Commonly known as:  COZAAR Take 1 tablet (25 mg total) by mouth daily.   metoprolol tartrate 25 MG tablet Commonly known as:  LOPRESSOR Take 1 tablet (25 mg total) by mouth 2 (two) times daily.   rosuvastatin 20 MG tablet Commonly known as:  CRESTOR Take 20 mg by mouth daily.   traMADol 50 MG tablet Commonly known as:  ULTRAM Take 1-2 tablets (50-100 mg total) by mouth every 4 (four) hours as needed for moderate pain.      Follow-up Information    Jettie Booze, MD Follow up.   Specialties:  Cardiology, Radiology, Interventional Cardiology Why:  Please see discharge paperwork for 2-week cardiology follow-up appointment. Contact information: [address] [address] [phone]        Grace Isaac, MD Follow up.   Specialty:  Cardiothoracic Surgery Why:  Please see discharge paperwork for follow-up appointment with the surgeon in 4  weeks.  Also obtain a chest x-ray at [facility] 1/2-hour prior to this appointment.  It is located in the same office complex on the first floor. Contact information: [address] [place] [address] [phone]          The patient has been discharged on:   1.Beta Blocker:  Yes [  y ]                              No   [   ]                              If No, reason:  2.Ace Inhibitor/ARB: Yes [ y  ]                                     No  [    ]                                     If No, reason:  3.Statin:   Yes [  y ]                  No  [   ]                  If No, reason:  4.Ecasa:  Yes  Blue.Reese  ]                  No   [   ]                  If No, reason:  Signed:  Original Note By Jadene Pierini PA-C  Update by:  Ellwood Handler, PA-C  03/16/2018, 8:52 AM

## 2018-03-14 NOTE — Plan of Care (Signed)
  Problem: Education: Goal: Will demonstrate proper wound care and an understanding of methods to prevent future damage Outcome: Progressing Goal: Knowledge of the prescribed therapeutic regimen will improve Outcome: Progressing   Problem: Activity: Goal: Risk for activity intolerance will decrease Outcome: Progressing   Problem: Cardiac: Goal: Will achieve and/or maintain hemodynamic stability Outcome: Progressing   Problem: Clinical Measurements: Goal: Postoperative complications will be avoided or minimized Outcome: Progressing   Problem: Skin Integrity: Goal: Wound healing without signs and symptoms of infection Outcome: Progressing Goal: Risk for impaired skin integrity will decrease Outcome: Progressing   Problem: Urinary Elimination: Goal: Ability to achieve and maintain adequate renal perfusion and functioning will improve Outcome: Progressing

## 2018-03-14 NOTE — Op Note (Deleted)
  The note originally documented on this encounter has been moved the the encounter in which it belongs.  

## 2018-03-14 NOTE — Progress Notes (Addendum)
[address]       [place] [address]             [phone]      2 Days Post-Op Procedure(s) (LRB): CORONARY ARTERY BYPASS GRAFTING (CABG) x four , using left internal mammary artery to LAD and sequential vein graft  to intermediate and diagonal and vein graft to posterior descending.   right thigh  greater saphenous endovein harvest (N/A) TRANSESOPHAGEAL ECHOCARDIOGRAM (TEE) (N/A) Subjective: Didn't sleep well d/t pain  Objective: Vital signs in last 24 hours: Temp:  [97.8 F (36.6 C)-98.1 F (36.7 C)] 98 F (36.7 C) (01/24 0400) Pulse Rate:  [64-141] 78 (01/24 0700) Cardiac Rhythm: Normal sinus rhythm (01/24 0400) Resp:  [15-29] 22 (01/24 0700) BP: (116-149)/(66-89) 137/89 (01/24 0700) SpO2:  [89 %-99 %] 92 % (01/24 0700) Weight:  [90.8 kg] 90.8 kg (01/24 0500)  Hemodynamic parameters for last 24 hours:    Intake/Output from previous day: 01/23 0701 - 01/24 0700 In: 2189.5 [P.O.:1440; I.V.:449.4; IV Piggyback:300.1] Out: 2710 [Urine:2660; Chest Tube:50] Intake/Output this shift: No intake/output data recorded.  General appearance: alert, cooperative and no distress Heart: regular rate and rhythm Lungs: min dim in bases Abdomen: benign, + BS, soft Extremities: no edema Wound: evh site looks good, sternal dressing in place  Lab Results: Recent Labs    03/13/18 1631 03/14/18 0400  WBC 9.4 9.2  HGB 12.0* 12.2*  HCT 35.2* 34.8*  PLT 139* 141*   BMET:  Recent Labs    03/13/18 1631 03/14/18 0400  NA 135 137  K 3.4* 4.2  CL 103 108  CO2 24 25  GLUCOSE 137* 122*  BUN 10 10  CREATININE 1.01 0.86  CALCIUM 8.3* 8.1*    PT/INR:  Recent Labs    03/12/18 1319  LABPROT 17.1*  INR 1.41   ABG    Component Value Date/Time   PHART 7.373 03/12/2018 1708   HCO3 22.3 03/12/2018 1708   TCO2 23 03/12/2018 1708   ACIDBASEDEF 3.0 (H) 03/12/2018 1708   O2SAT 92.0 03/12/2018 1708   CBG (last 3)  Recent Labs    03/13/18 2030  03/13/18 2329 03/14/18 0434  GLUCAP 107* 117* 117*    Meds Scheduled Meds: . acetaminophen  1,000 mg Oral Q6H   Or  . acetaminophen (TYLENOL) oral liquid 160 mg/5 mL  1,000 mg Per Tube Q6H  . aspirin EC  325 mg Oral Daily   Or  . aspirin  324 mg Per Tube Daily  . bisacodyl  10 mg Oral Daily   Or  . bisacodyl  10 mg Rectal Daily  . Chlorhexidine Gluconate Cloth  6 each Topical Daily  . docusate sodium  200 mg Oral Daily  . enoxaparin (LOVENOX) injection  40 mg Subcutaneous QHS  . insulin aspart  0-24 Units Subcutaneous Q4H  . metoprolol tartrate  12.5 mg Oral BID   Or  . metoprolol tartrate  12.5 mg Per Tube BID  . mupirocin ointment  1 application Nasal BID  . pantoprazole  40 mg Oral Daily  . rosuvastatin  20 mg Oral Daily  . sodium chloride flush  10-40 mL Intracatheter Q12H  . sodium chloride flush  3 mL Intravenous Q12H   Continuous Infusions: . sodium chloride    . cefUROXime (ZINACEF)  IV Stopped (03/13/18 2027)  . lactated ringers    . lactated ringers    . lactated ringers 20 mL/hr at 03/14/18 0700  . nitroGLYCERIN    .  phenylephrine (NEO-SYNEPHRINE) Adult infusion Stopped (03/12/18 1459)   PRN Meds:.ketorolac, lactated ringers, metoprolol tartrate, midazolam, morphine injection, ondansetron (ZOFRAN) IV, oxyCODONE, sodium chloride flush, sodium chloride flush, traMADol  Xrays Dg Chest Port 1 View  Result Date: 03/14/2018 CLINICAL DATA:  Chest pain EXAM: PORTABLE CHEST 1 VIEW COMPARISON:  March 13, 2018 FINDINGS: Left chest tube and mediastinal drain have been removed. Swan-Ganz catheter is been removed with Cordis tip remaining in the superior vena cava. Temporary pacemaker wires remain attached to the right heart. There is no appreciable pneumothorax. There are minimal pleural effusions bilaterally with atelectatic change in the left base. The lungs elsewhere are clear. Heart is mildly enlarged, stable, with pulmonary vascularity normal. Status post coronary  artery bypass grafting. No adenopathy. No bone lesions. IMPRESSION: No pneumothorax. Cordis tip in superior vena cava. Atelectatic change left base with minimal pleural effusions bilaterally. No consolidation. Stable cardiac prominence. Electronically Signed   By: Lowella Grip III M.D.   On: 03/14/2018 07:07   Dg Chest Port 1 View  Result Date: 03/13/2018 CLINICAL DATA:  CABG EXAM: PORTABLE CHEST 1 VIEW COMPARISON:  03/12/2018 FINDINGS: Endotracheal and NG tubes removed. Swan-Ganz catheter stable. Left chest tube and mediastinal tube are stable. No pneumothorax. Normal vascularity. Heart is mildly enlarged. Small pleural effusions and bibasilar atelectasis persist. IMPRESSION: Extubated. No evidence of pneumothorax or CHF. Electronically Signed   By: Marybelle Killings M.D.   On: 03/13/2018 08:30   Dg Chest Port 1 View  Result Date: 03/12/2018 CLINICAL DATA:  Status post CABG. EXAM: PORTABLE CHEST 1 VIEW COMPARISON:  03/10/2018 FINDINGS: Swan-Ganz catheter tip projects over the right ventricular outflow tract. ET tube tip is above the carina. Enteric tube tip is below the GE junction. Mediastinal drain and left chest tube in place. No significant pneumothorax identified. Heart size appears normal. There is subsegmental atelectasis in the left midlung and left base. No pulmonary edema. IMPRESSION: 1. Support apparatus positioned as above. 2. Left chest tube in place without pneumothorax. 3. Left midlung and left base subsegmental atelectasis. Electronically Signed   By: Kerby Moors M.D.   On: 03/12/2018 13:54    Assessment/Plan: S/P Procedure(s) (LRB): CORONARY ARTERY BYPASS GRAFTING (CABG) x four , using left internal mammary artery to LAD and sequential vein graft  to intermediate and diagonal and vein graft to posterior descending.   right thigh  greater saphenous endovein harvest (N/A) TRANSESOPHAGEAL ECHOCARDIOGRAM (TEE) (N/A)  1 doing well POD#2 2 sinus rhythm with PVC's - will increase beta  blocker dose , K+/Mg++ ok 3 BP elevated , will add low dose ARB 4 renal fxn normal 5 H/H is stable 6 platelet count normalizing trend 7 BS adeq controlled 8 will add dilaudid for pain which he can tolerate from GI perspective 9 cont rehab/pulm toilet, CXR minor atx with small effusions 10 tx 4e  LOS: 2 days    Jeffery Giovanni PA-C 03/14/2018 Pager [phone]  Trouble sleeping last night Change oxycodone po to po dilaudid Add low dose ARB and increase beta blocker  D/c central line  I have seen and examined Jeffery Hunter and agree with the above assessment  and plan.  Grace Isaac MD Beeper [phone] Office [phone] 03/14/2018 8:45 AM

## 2018-03-14 NOTE — Progress Notes (Signed)
Patient ID: Jeffery Hunter, male   DOB: 1955-07-20, 63 y.o.   MRN: [id number] EVENING ROUNDS NOTE :     [address]       [place] [address]             [phone]                 2 Days Post-Op Procedure(s) (LRB): CORONARY ARTERY BYPASS GRAFTING (CABG) x four , using left internal mammary artery to LAD and sequential vein graft  to intermediate and diagonal and vein graft to posterior descending.   right thigh  greater saphenous endovein harvest (N/A) TRANSESOPHAGEAL ECHOCARDIOGRAM (TEE) (N/A)  Total Length of Stay:  LOS: 2 days  BP 133/78   Pulse 73   Temp 98.1 F (36.7 C) (Oral)   Resp (!) 24   Ht 5\' 11"  (1.803 m)   Wt 90.8 kg   SpO2 93%   BMI 27.92 kg/m   .Intake/Output      01/24 0701 - 01/25 0700   P.O. 450   I.V. (mL/kg)    IV Piggyback    Total Intake(mL/kg) 450 (5)   Urine (mL/kg/hr) 100 (0.1)   Stool 0   Chest Tube    Total Output 100   Net +350       Urine Occurrence 1 x   Stool Occurrence 1 x     . sodium chloride       Lab Results  Component Value Date   WBC 9.2 03/14/2018   HGB 12.2 (L) 03/14/2018   HCT 34.8 (L) 03/14/2018   PLT 141 (L) 03/14/2018   GLUCOSE 122 (H) 03/14/2018   ALT 31 03/10/2018   AST 31 03/10/2018   NA 137 03/14/2018   K 4.2 03/14/2018   CL 108 03/14/2018   CREATININE 0.86 03/14/2018   BUN 10 03/14/2018   CO2 25 03/14/2018   INR 1.41 03/12/2018   HGBA1C 5.8 (H) 03/10/2018   Stable waiting for stepdown bed Walking around unit without difficulity   Grace Isaac MD  Beeper [phone] Office [phone] 03/14/2018 8:31 PM

## 2018-03-14 NOTE — Op Note (Signed)
NAME: Jeffery Hunter, Jeffery Hunter. MEDICAL RECORD ZD:[id number] ACCOUNT [id number] DATE OF BIRTH:[birth year] FACILITY: [facility] LOCATION: [facility] PH[facility]ICIAN:Samiyah Stupka Maryruth Bun, MD  OPERATIVE REPORT  DATE OF PROCEDURE:  03/12/2018  PREOPERATIVE DIAGNOSIS:  Coronary occlusive disease.  POSTOPERATIVE DIAGNOSIS:  Coronary occlusive disease.  SURGICAL PROCEDURE:  Coronary artery bypass grafting x4 with the left internal mammary to the left anterior descending coronary artery, sequential reverse saphenous vein graft to the intermediate and diagonal, reverse saphenous vein graft to the  posterior descending with right greater saphenous thigh endoscopic vein harvesting.  SURGEON:  Lanelle Bal, MD  FIRST ASSISTANT:  Jeffery Hunter, [place].  BRIEF HISTORY:  The patient is a 63 year old male with strong positive family history for coronary occlusive disease.  He had had no previous known history of myocardial infarction, but because of nonacute change in his EKG with development of small  Q-waves and strong family history, calcium score testing was done which was significantly elevated. Cardiac CT followed with FFR calculations.  Because of the findings on a cardiac CT, cardiac catheterization was recommended and performed by Dr.  Irish Hunter.  Overall the patient had preserved LV function, but with slight hypokinesis in the inferior wall.  At the time of cardiac catheterization, he was found to have total occlusion of his right coronary artery with collateral filling from the LAD.   The circumflex was a very large dominant vessel without significant stenosis, 80-90% stenosis in a small intermediate and also the diagonal.  At the takeoff of the proximal third of the LAD was a 75-80% stenosis of the LAD with significant calcification  of the vessel more proximally.  CT FFR suggested that this lesion was highly significant in flow reduction.  The patient was referred by cardiology after careful review of options for  treatment.  The patient exercises on a regular basis and denies  anginal symptoms.  Proceeding with coronary artery bypass grafting was recommended with the stenosis of the LAD and supplying collaterals to the inferior wall.  The loss of his left LAD would be catastrophic event.  The patient agreed and signed agreed  to proceeding with surgery and signed informed consent.  DESCRIPTION OF PROCEDURE:  With Swan-Ganz and arterial line monitors in place, the patient underwent general endotracheal anesthesia without incident.  Skin the chest and legs was prepped with Betadine, draped in the usual sterile manner.  Jeffery Hunter  placed a TEE probe which showed good functioning of his cardiac valves, slight hypokinesis of the inferior wall consistent with an old inferior myocardial infarction.  Appropriate timeout was performed and we proceeded with endoscopic vein harvesting of  the right greater saphenous vein from the thigh.  Median sternotomy was performed.  The left internal mammary artery was dissected down as a pedicle graft.  The distal artery was divided and had excellent free flow.  The vessel was of good quality and  caliber.  The pericardium was opened.  Overall, ventricular function appeared preserved with some scattered mottling consistent with old inferior myocardial infarction.  The patient was systemically heparinized.  The ascending aorta was cannulated.  The  right atrium was cannulated.  Aortic root vent cardioplegia needle was introduced into the ascending aorta.  The patient was placed on cardiopulmonary bypass 2.4 liters per minute per meter square.  Sites of anastomosis were selected and dissected out of  the epicardium.  Aortic crossclamp was applied and 500 mL of cold blood potassium cardioplegia was administered antegrade.  Myocardial septal temperature was monitored throughout the crossclamp.  We turned our attention first to the distal right coronary artery, which was significantly  calcified.  The proximal posterior descending was a soft vessel with reasonable caliber.  Vessel was opened and admitted a 1.5 mm probe distally.  Using a running  7-0 Prolene, a segment of reverse saphenous vein graft was anastomosed to the posterior descending.  Additional cold blood cardioplegia was administered.  We then turned our attention to the intermediate and diagonal vessels on the lateral wall.  Both of  these were relatively small, but suitable for bypass.  Areas of calcification in the intermediate were easily identified.  The intermediate vessel was opened, admitted 1 mm probe distally.  Using a longitudinal side-to-side anastomosis with a running  8-0 Prolene, a segment of reverse saphenous vein graft was anastomosed to the intermediate.  The distal extent of the same vein was then trimmed and the diagonal vessel was opened and anastomosis in a sequential fashion to the diagonal coronary artery  was carried out.  We then turned our attention to the left anterior descending coronary artery and the midportion of the LAD became obvious on the surface of the heart and was of good quality and caliber.  In the midportion of the LAD, the vessel was  opened and easily admitted a 1.5 mm probe proximally and distally.  Using a running 8-0 Prolene, the left internal mammary artery was anastomosed to left anterior descending coronary artery.  With cross clamp still in place, 2 punch aortotomies were  performed.  Each of the 2 vein grafts were anastomosed to the ascending aorta.  The bulldog on the mammary artery was removed with prompt rise in myocardial septal temperature.  The heart was allowed to passively fill and deair and the proximal  anastomoses were completed.  Aortic crossclamp was then removed with total crossclamp time of 88 minutes.  The fascia of the left internal mammary artery was tacked to the epicardium.  Sites of anastomosis were inspected and were free of bleeding.  The  patient's  body temperature was rewarmed to 37 degrees.  Atrial and ventricular pacing wires were applied.  The patient was essentially paced to increased rate.  With the body temperature rewarmed to 37 degrees, he was then ventilated and weaned from  cardiopulmonary bypass without difficulty.  No pressors were needed.  He remained hemodynamically stable.  He was decannulated in the usual fashion.  Protamine sulfate was administered.  With operative field hemostatic, graft markers were applied.  A  left pleural tube and a Blake mediastinal drain were left in place.  The pericardium was loosely reapproximated.  Sternum was closed with #6 stainless steel wire.  The fascia closed with interrupted 0 Vicryl, running 3-0 Vicryl, subcutaneous tissue, 4-0  subcuticular stitch in skin edges.  Dry dressings were applied.  Sponge and needle count was reported as correct at completion of the procedure.  RF scan and reported clear.  Total pump time was 120 minutes.    The patient did not require any blood bank blood products during the operative procedure.  AN/NUANCE  D:03/14/2018 T:03/14/2018 JOB:[id number]

## 2018-03-15 ENCOUNTER — Inpatient Hospital Stay (HOSPITAL_COMMUNITY): Payer: No Typology Code available for payment source

## 2018-03-15 LAB — BASIC METABOLIC PANEL
Anion gap: 8 (ref 5–15)
BUN: 9 mg/dL (ref 8–23)
CO2: 26 mmol/L (ref 22–32)
Calcium: 8.5 mg/dL — ABNORMAL LOW (ref 8.9–10.3)
Chloride: 102 mmol/L (ref 98–111)
Creatinine, Ser: 1 mg/dL (ref 0.61–1.24)
GFR calc Af Amer: >60 mL/min (ref >60)
GFR calc non Af Amer: >60 mL/min (ref >60)
Glucose, Bld: 109 mg/dL — ABNORMAL HIGH (ref 70–99)
Potassium: 4.5 mmol/L (ref 3.5–5.1)
Sodium: 136 mmol/L (ref 135–145)

## 2018-03-15 LAB — CBC
HCT: 38.9 % — ABNORMAL LOW (ref 39.0–52.0)
Hemoglobin: 13.1 g/dL (ref 13.0–17.0)
MCH: 28.6 pg (ref 26.0–34.0)
MCHC: 33.7 g/dL (ref 30.0–36.0)
MCV: 84.9 fL (ref 80.0–100.0)
Platelets: 153 10*3/uL (ref 150–400)
RBC: 4.58 MIL/uL (ref 4.22–5.81)
RDW: 12.1 % (ref 11.5–15.5)
WBC: 9.6 10*3/uL (ref 4.0–10.5)
nRBC: 0 % (ref 0.0–0.2)

## 2018-03-15 NOTE — Progress Notes (Signed)
CARDIAC REHAB PHASE I   Pt is ambulating independently throughout unit. Pt just had a recent walk. Started education with pt. Reviewed sternal precautions, restrictions, exercise guidelines, IS use, heart healthy nutrition, Cardiac Rehab. Pt states he is active at [facility], may not participate in program, but okay with referral sent to [facility]. Will send referral. Pt verbalized understanding.   [phone]  Carma Lair MS, ACSM CEP  1:50 PM 03/15/2018

## 2018-03-15 NOTE — Progress Notes (Deleted)
Patient refused morning walk. Nurse educated patient on the importance of mobility. Patient verbalizes understanding.

## 2018-03-15 NOTE — Progress Notes (Signed)
Patient ID: Jeffery Hunter, male   DOB: April 06, 1955, 63 y.o.   MRN: [id number] TCTS DAILY ICU PROGRESS NOTE                   [address]            [place] [address]          [phone]   3 Days Post-Op Procedure(s) (LRB): CORONARY ARTERY BYPASS GRAFTING (CABG) x four , using left internal mammary artery to LAD and sequential vein graft  to intermediate and diagonal and vein graft to posterior descending.   right thigh  greater saphenous endovein harvest (N/A) TRANSESOPHAGEAL ECHOCARDIOGRAM (TEE) (N/A)  Total Length of Stay:  LOS: 3 days   Subjective: Feels well ambulating around unit, waiting for stepdown bed  Objective: Vital signs in last 24 hours: Temp:  [97 F (36.1 C)-98.1 F (36.7 C)] 98 F (36.7 C) (01/25 0746) Pulse Rate:  [73-89] 89 (01/24 2146) Cardiac Rhythm: Normal sinus rhythm (01/25 0721) Resp:  [21-29] 25 (01/24 2100) BP: (117-147)/(72-83) 147/72 (01/24 2146) SpO2:  [91 %-93 %] 93 % (01/24 1000) Weight:  [89.9 kg] 89.9 kg (01/25 0500)  Filed Weights   03/13/18 0500 03/14/18 0500 03/15/18 0500  Weight: 88.7 kg 90.8 kg 89.9 kg    Weight change: -0.91 kg   Hemodynamic parameters for last 24 hours:    Intake/Output from previous day: 01/24 0701 - 01/25 0700 In: 570 [P.O.:570] Out: 100 [Urine:100]  Intake/Output this shift: No intake/output data recorded.  Current Meds: Scheduled Meds: . acetaminophen  1,000 mg Oral Q6H   Or  . acetaminophen (TYLENOL) oral liquid 160 mg/5 mL  1,000 mg Per Tube Q6H  . aspirin EC  325 mg Oral Daily   Or  . aspirin  324 mg Per Tube Daily  . bisacodyl  10 mg Oral Daily   Or  . bisacodyl  10 mg Rectal Daily  . docusate sodium  200 mg Oral Daily  . enoxaparin (LOVENOX) injection  40 mg Subcutaneous QHS  . losartan  25 mg Oral Daily  . metoprolol tartrate  25 mg Oral BID   Or  . metoprolol tartrate  12.5 mg Per Tube BID  . moving right along book   Does not apply Once  . mupirocin ointment  1  application Nasal BID  . pantoprazole  40 mg Oral Daily  . rosuvastatin  20 mg Oral Daily  . sodium chloride flush  3 mL Intravenous Q12H   Continuous Infusions: . sodium chloride     PRN Meds:.sodium chloride, HYDROmorphone, ketorolac, metoprolol tartrate, ondansetron (ZOFRAN) IV, sodium chloride flush, traMADol  General appearance: alert, cooperative and no distress Neurologic: intact Heart: regular rate and rhythm, S1, S2 normal, no murmur, click, rub or gallop Lungs: clear to auscultation bilaterally Abdomen: soft, non-tender; bowel sounds normal; no masses,  no organomegaly Extremities: extremities normal, atraumatic, no cyanosis or edema and Homans sign is negative, no sign of DVT Wound: Sternum stable healing well without evidence of infection  Lab Results: CBC: Recent Labs    03/14/18 0400 03/15/18 0401  WBC 9.2 9.6  HGB 12.2* 13.1  HCT 34.8* 38.9*  PLT 141* 153   BMET:  Recent Labs    03/14/18 0400 03/15/18 0401  NA 137 136  K 4.2 4.5  CL 108 102  CO2 25 26  GLUCOSE 122* 109*  BUN 10 9  CREATININE 0.86 1.00  CALCIUM 8.1* 8.5*    CMET:  Lab Results  Component Value Date   WBC 9.6 03/15/2018   HGB 13.1 03/15/2018   HCT 38.9 (L) 03/15/2018   PLT 153 03/15/2018   GLUCOSE 109 (H) 03/15/2018   ALT 31 03/10/2018   AST 31 03/10/2018   NA 136 03/15/2018   K 4.5 03/15/2018   CL 102 03/15/2018   CREATININE 1.00 03/15/2018   BUN 9 03/15/2018   CO2 26 03/15/2018   INR 1.41 03/12/2018   HGBA1C 5.8 (H) 03/10/2018      PT/INR:  Recent Labs    03/12/18 1319  LABPROT 17.1*  INR 1.41   Radiology: Dg Chest 2 View  Result Date: 03/15/2018 CLINICAL DATA:  CABG x4 3 days ago. EXAM: CHEST - 2 VIEW COMPARISON:  Chest x-ray dated 03/14/2018. FINDINGS: Heart size and mediastinal contours are stable. Median sternotomy wires appear intact and stable in alignment. RIGHT IJ Cordis has been removed. Mild atelectasis at the LEFT lung base. Lungs otherwise clear. No  pneumothorax seen. IMPRESSION: Mild atelectasis at the LEFT lung base. Lungs otherwise clear. No evidence of pneumonia or pulmonary edema. Electronically Signed   By: Franki Cabot M.D.   On: 03/15/2018 05:26     Assessment/Plan: S/P Procedure(s) (LRB): CORONARY ARTERY BYPASS GRAFTING (CABG) x four , using left internal mammary artery to LAD and sequential vein graft  to intermediate and diagonal and vein graft to posterior descending.   right thigh  greater saphenous endovein harvest (N/A) TRANSESOPHAGEAL ECHOCARDIOGRAM (TEE) (N/A) Plan for transfer to step-down: see transfer orders - waiting x2 days Pacing wires out Possible home in a.m.     Grace Isaac 03/15/2018 8:08 AM

## 2018-03-15 NOTE — Progress Notes (Signed)
Patient ID: Jeffery Hunter, male   DOB: 1955/03/23, 63 y.o.   MRN: [id number] EVENING ROUNDS NOTE :     [address]       [place] [address]             [phone]                 3 Days Post-Op Procedure(s) (LRB): CORONARY ARTERY BYPASS GRAFTING (CABG) x four , using left internal mammary artery to LAD and sequential vein graft  to intermediate and diagonal and vein graft to posterior descending.   right thigh  greater saphenous endovein harvest (N/A) TRANSESOPHAGEAL ECHOCARDIOGRAM (TEE) (N/A)  Total Length of Stay:  LOS: 3 days  BP 124/78 (BP Location: Right Arm)   Pulse 79   Temp 98.2 F (36.8 C) (Oral)   Resp (!) 23   Ht 5\' 11"  (1.803 m)   Wt 89.9 kg   SpO2 94%   BMI 27.64 kg/m   .Intake/Output      01/25 0701 - 01/26 0700   P.O. 540   Total Intake(mL/kg) 540 (6)   Urine (mL/kg/hr) 600 (0.5)   Stool    Total Output 600   Net -60       Urine Occurrence 1 x     . sodium chloride       Lab Results  Component Value Date   WBC 9.6 03/15/2018   HGB 13.1 03/15/2018   HCT 38.9 (L) 03/15/2018   PLT 153 03/15/2018   GLUCOSE 109 (H) 03/15/2018   ALT 31 03/10/2018   AST 31 03/10/2018   NA 136 03/15/2018   K 4.5 03/15/2018   CL 102 03/15/2018   CREATININE 1.00 03/15/2018   BUN 9 03/15/2018   CO2 26 03/15/2018   INR 1.41 03/12/2018   HGBA1C 5.8 (H) 03/10/2018   Stable day , poss home in am    Grace Isaac MD  Beeper [phone] Office [phone] 03/15/2018 7:52 PM

## 2018-03-16 MED ORDER — LOSARTAN POTASSIUM 25 MG PO TABS: 25.0000 mg | ORAL_TABLET | Freq: Every day | ORAL | 3 refills | Status: DC

## 2018-03-16 MED ORDER — ASPIRIN 325 MG PO TBEC: 325.0000 mg | DELAYED_RELEASE_TABLET | Freq: Every day | ORAL | 0 refills | Status: DC

## 2018-03-16 MED ORDER — TRAMADOL HCL 50 MG PO TABS: 50.0000 mg | ORAL_TABLET | ORAL | 0 refills | Status: DC | PRN

## 2018-03-16 MED ORDER — METOPROLOL TARTRATE 25 MG PO TABS: 25.0000 mg | ORAL_TABLET | Freq: Two times a day (BID) | ORAL | 3 refills | Status: DC

## 2018-03-16 MED ORDER — ACETAMINOPHEN 500 MG PO TABS: 1000.0000 mg | ORAL_TABLET | Freq: Four times a day (QID) | ORAL | 0 refills | Status: DC | PRN

## 2018-03-16 NOTE — Plan of Care (Signed)
Patient being discharged today. Ambulating well pain under contraol

## 2018-03-16 NOTE — Consult Note (Signed)
[facility] DAILY ICU PROGRESS NOTE                   [address]            [place] [address]          [phone]   4 Days Post-Op Procedure(s) (LRB): CORONARY ARTERY BYPASS GRAFTING (CABG) x four , using left internal mammary artery to LAD and sequential vein graft  to intermediate and diagonal and vein graft to posterior descending.   right thigh  greater saphenous endovein harvest (N/A) TRANSESOPHAGEAL ECHOCARDIOGRAM (TEE) (N/A)  Total Length of Stay:  LOS: 4 days   Subjective: Stable night  Objective: Vital signs in last 24 hours: Temp:  [97.9 F (36.6 C)-98.5 F (36.9 C)] 97.9 F (36.6 C) (01/26 0745) Pulse Rate:  [67-86] 67 (01/25 2145) Cardiac Rhythm: Normal sinus rhythm (01/25 2000) Resp:  [17-32] 21 (01/26 0600) BP: (94-143)/(70-95) 128/95 (01/26 0600) SpO2:  [92 %-96 %] 95 % (01/26 0600) Weight:  [89 kg] 89 kg (01/26 0600)  Filed Weights   03/14/18 0500 03/15/18 0500 03/16/18 0600  Weight: 90.8 kg 89.9 kg 89 kg    Weight change: -0.9 kg   Hemodynamic parameters for last 24 hours:    Intake/Output from previous day: 01/25 0701 - 01/26 0700 In: 900 [P.O.:900] Out: 600 [Urine:600]  Intake/Output this shift: No intake/output data recorded.  Current Meds: Scheduled Meds: . acetaminophen  1,000 mg Oral Q6H   Or  . acetaminophen (TYLENOL) oral liquid 160 mg/5 mL  1,000 mg Per Tube Q6H  . aspirin EC  325 mg Oral Daily   Or  . aspirin  324 mg Per Tube Daily  . bisacodyl  10 mg Oral Daily   Or  . bisacodyl  10 mg Rectal Daily  . docusate sodium  200 mg Oral Daily  . enoxaparin (LOVENOX) injection  40 mg Subcutaneous QHS  . losartan  25 mg Oral Daily  . metoprolol tartrate  25 mg Oral BID   Or  . metoprolol tartrate  12.5 mg Per Tube BID  . moving right along book   Does not apply Once  . mupirocin ointment  1 application Nasal BID  . pantoprazole  40 mg Oral Daily  . rosuvastatin  20 mg Oral Daily  . sodium chloride flush  3 mL  Intravenous Q12H   Continuous Infusions: . sodium chloride     PRN Meds:.sodium chloride, HYDROmorphone, metoprolol tartrate, ondansetron (ZOFRAN) IV, sodium chloride flush, traMADol  General appearance: alert, cooperative and no distress Neurologic: intact Heart: regular rate and rhythm, S1, S2 normal, no murmur, click, rub or gallop Lungs: clear to auscultation bilaterally Abdomen: soft, non-tender; bowel sounds normal; no masses,  no organomegaly Extremities: extremities normal, atraumatic, no cyanosis or edema and Homans sign is negative, no sign of DVT Wound: sternum stable  Lab Results: CBC: Recent Labs    03/14/18 0400 03/15/18 0401  WBC 9.2 9.6  HGB 12.2* 13.1  HCT 34.8* 38.9*  PLT 141* 153   BMET:  Recent Labs    03/14/18 0400 03/15/18 0401  NA 137 136  K 4.2 4.5  CL 108 102  CO2 25 26  GLUCOSE 122* 109*  BUN 10 9  CREATININE 0.86 1.00  CALCIUM 8.1* 8.5*    CMET: Lab Results  Component Value Date   WBC 9.6 03/15/2018   HGB 13.1 03/15/2018   HCT 38.9 (L) 03/15/2018   PLT 153 03/15/2018   GLUCOSE  109 (H) 03/15/2018   ALT 31 03/10/2018   AST 31 03/10/2018   NA 136 03/15/2018   K 4.5 03/15/2018   CL 102 03/15/2018   CREATININE 1.00 03/15/2018   BUN 9 03/15/2018   CO2 26 03/15/2018   INR 1.41 03/12/2018   HGBA1C 5.8 (H) 03/10/2018      PT/INR: No results for input(s): LABPROT, INR in the last 72 hours. Radiology: No results found.   Assessment/Plan: S/P Procedure(s) (LRB): CORONARY ARTERY BYPASS GRAFTING (CABG) x four , using left internal mammary artery to LAD and sequential vein graft  to intermediate and diagonal and vein graft to posterior descending.   right thigh  greater saphenous endovein harvest (N/A) TRANSESOPHAGEAL ECHOCARDIOGRAM (TEE) (N/A) Stable, ambulating well, wounds all intact  Plan home today on beta blocker, ace, statin, asa. Given instructions about lifting wound care Referred to cardiac rehab done  H/h  stable   Jeffery Hunter 03/16/2018 8:24 AM

## 2018-03-16 NOTE — Progress Notes (Signed)
pateint eager for discharge. Ambulating well, denies any pain currently. Dressing by self. Chest tube sutures removed, dry no oozing. Covered with dry dressing and tape.  Taken off telmetry . IV out. Wife  Arriving shortly. Discharge instructions provided to patient.. Patient verbalizes understanding. scorrea DNP MSN,RN

## 2018-03-16 NOTE — Progress Notes (Signed)
Discharged via Wheelchair to home. Patient states he has all his belongings.

## 2018-03-16 NOTE — Discharge Instructions (Signed)

## 2018-03-17 ENCOUNTER — Telehealth (HOSPITAL_COMMUNITY): Payer: Self-pay

## 2018-03-17 NOTE — Telephone Encounter (Signed)
Called and spoke with pt in regards to CR, pt stated he is already a member at the [facility] and will be going back there by the time he will be able to join our program.   Closed referral

## 2018-03-18 MED FILL — Heparin Sodium (Porcine) Inj 1000 Unit/ML: INTRAMUSCULAR | Qty: 10 | Status: AC

## 2018-03-18 MED FILL — Sodium Chloride IV Soln 0.9%: INTRAVENOUS | Qty: 2000 | Status: AC

## 2018-03-18 MED FILL — Mannitol IV Soln 20%: INTRAVENOUS | Qty: 500 | Status: AC

## 2018-03-18 MED FILL — Electrolyte-R (PH 7.4) Solution: INTRAVENOUS | Qty: 4000 | Status: AC

## 2018-03-18 MED FILL — Lidocaine HCl(Cardiac) IV PF Soln Pref Syr 100 MG/5ML (2%): INTRAVENOUS | Qty: 5 | Status: AC

## 2018-03-18 MED FILL — Sodium Bicarbonate IV Soln 8.4%: INTRAVENOUS | Qty: 50 | Status: AC

## 2018-03-31 NOTE — Progress Notes (Signed)
Cardiology Office Note    Date:  04/01/2018   ID:  Williams, Dietrick September 14, 1955, MRN [id number]  PCP:  Crist Infante, MD  Cardiologist: Larae Grooms, MD EPS: None  Chief Complaint  Patient presents with  . 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  Also had some tachycardia that resolved with carotid massage.  He exercises regularly without chest pain,  has a positive family history of CAD and recent calcium score was elevated at 1100.  Cardiac CTA showed possible high-grade stenosis in the RCA which led to cardiac catheterization.  He was found to have a total RCA with collateral flow from the LAD, 75% LAD and 90% small intermediate.  FFR calculations were significant for hemodynamic significant LAD lesion.  Patient underwent CABG x4 with LIMA to the LAD, sequential SVG to intermediate and diagonal, SVG to PDA by Dr. Servando Snare.  Intra-Op TEE EF 40 to 45% with inferior wall mild hypokinesis, mild LVH.   Patient comes in today accompanied by his wife.  He walks 3.2 miles through the [place] with his daughter without difficulty.  He is exercising daily and feels great.  Has kept a regular log of his blood pressures and they are well controlled.  No chest pain or shortness of breath.  No edema.  Past Medical History:  Diagnosis Date  . Adenomatous polyp of colon   . BPH with urinary obstruction   . Coronary artery disease   . High frequency hearing loss   . History of kidney stones   . Hx of colonoscopy   . Hyperlipemia   . Impacted cerumen of both ears   . Kidney stones   . Nephrolithiasis   . Tuberculosis     Past Surgical History:  Procedure Laterality Date  . CORONARY ARTERY BYPASS GRAFT N/A 03/12/2018   Procedure: CORONARY ARTERY BYPASS GRAFTING (CABG) x four , using  left internal mammary artery to LAD and sequential vein graft  to intermediate and diagonal and vein graft to posterior descending.   right thigh  greater saphenous endovein harvest;  Surgeon: Grace Isaac, MD;  Location: [place];  Service: Open Heart Surgery;  Laterality: N/A;  . CYSTOSCOPY WITH RETROGRADE PYELOGRAM, URETEROSCOPY AND STENT PLACEMENT Bilateral 10/16/2013   Procedure: CYSTOSCOPY WITH BILATERAL RETROGRADE PYELOGRAM, BILATERAL URETEROSCOPY AND bilateral STENT PLACEMENT, bilateral urethral dilation HOLMIUM LASER;  Surgeon: Claybon Jabs, MD;  Location: [facility];  Service: Urology;  Laterality: Bilateral;  . HOLMIUM LASER APPLICATION Bilateral 9/67/8938   Procedure: HOLMIUM LASER APPLICATION;  Surgeon: Claybon Jabs, MD;  Location: [facility];  Service: Urology;  Laterality: Bilateral;  . LEFT HEART CATH AND CORONARY ANGIOGRAPHY N/A 03/04/2018   Procedure: LEFT HEART CATH AND CORONARY ANGIOGRAPHY;  Surgeon: Jettie Booze, MD;  Location: [facility];  Service: Cardiovascular;  Laterality: N/A;  . TEE WITHOUT CARDIOVERSION N/A 03/12/2018   Procedure: TRANSESOPHAGEAL ECHOCARDIOGRAM (TEE);  Surgeon: Grace Isaac, MD;  Location: [facility];  Service: Open Heart Surgery;  Laterality: N/A;    Current Medications: Current Meds  Medication Sig  . acetaminophen (TYLENOL) 500 MG tablet Take 2 tablets (1,000 mg total) by mouth every 6 (six) hours as needed.  Marland Kitchen aspirin EC 325 MG EC tablet Take 1 tablet (325 mg total)  by mouth daily.  Marland Kitchen co-enzyme Q-10 30 MG capsule Take 20 mg by mouth daily.  Marland Kitchen losartan (COZAAR) 25 MG tablet Take 1 tablet (25 mg total) by mouth daily.  . metoprolol tartrate (LOPRESSOR) 25 MG tablet Take 1 tablet (25 mg total) by mouth 2 (two) times daily.  . rosuvastatin (CRESTOR) 20 MG tablet Take 20 mg by mouth daily.  . traMADol (ULTRAM) 50 MG tablet Take 1-2 tablets (50-100 mg total) by mouth every 4 (four) hours as needed for moderate pain.     Allergies:    Oxycodone   Social History   Socioeconomic History  . Marital status: Married    Spouse name: Not on file  . Number of children: Not on file  . Years of education: Not on file  . Highest education level: Not on file  Occupational History  . Not on file  Social Needs  . Financial resource strain: Not on file  . Food insecurity:    Worry: Not on file    Inability: Not on file  . Transportation needs:    Medical: Not on file    Non-medical: Not on file  Tobacco Use  . Smoking status: Former Smoker    Packs/day: 0.25    Types: Cigarettes  . Smokeless tobacco: Never Used  Substance and Sexual Activity  . Alcohol use: Yes    Alcohol/week: 7.0 standard drinks    Types: 7 Cans of beer per week  . Drug use: No  . Sexual activity: Yes  Lifestyle  . Physical activity:    Days per week: Not on file    Minutes per session: Not on file  . Stress: Not on file  Relationships  . Social connections:    Talks on phone: Not on file    Gets together: Not on file    Attends religious service: Not on file    Active member of club or organization: Not on file    Attends meetings of clubs or organizations: Not on file    Relationship status: Not on file  Other Topics Concern  . Not on file  Social History Narrative  . Not on file     Family History:  The patient's family history includes AAA (abdominal aortic aneurysm) in his maternal grandfather; Alcohol abuse in his maternal grandmother, mother, and paternal grandfather; Alzheimer's disease in his mother; Breast cancer in his paternal grandmother; Colon polyps in his father; HIV in his brother; Healthy in his daughter, daughter, grandchild, and son; Heart Problems in his father and maternal grandfather; Hypertension in his mother; Prostate cancer in his father.   ROS:   Please see the history of present illness.    Review of Systems  Constitution: Positive for malaise/fatigue.  HENT: Negative.   Cardiovascular: Negative.     Respiratory: Negative.   Endocrine: Negative.   Hematologic/Lymphatic: Negative.   Musculoskeletal: Negative.   Gastrointestinal: Negative.   Genitourinary: Negative.   Neurological: Negative.    All other systems reviewed and are negative.   PHYSICAL EXAM:   VS:  BP 128/82 (BP Location: Left Arm, Patient Position: Sitting, Cuff Size: Normal)   Pulse 72   Ht 5' 11.5" (1.816 m)   Wt 193 lb 12.8 oz (87.9 kg)   SpO2 97%   BMI 26.65 kg/m   Physical Exam  GEN: Well nourished, well developed, in no acute distress  Neck: no JVD, carotid bruits, or masses Cardiac: Incisions healing well RRR; no murmurs, rubs, or gallops  Respiratory:  clear to auscultation bilaterally, normal work of breathing GI: soft, nontender, nondistended, + BS Ext: without cyanosis, clubbing, or edema, Good distal pulses bilaterally Neuro:  Alert and Oriented x 3 Psych: euthymic mood, full affect  Wt Readings from Last 3 Encounters:  04/01/18 193 lb 12.8 oz (87.9 kg)  03/16/18 196 lb 3.4 oz (89 kg)  03/10/18 197 lb 11.2 oz (89.7 kg)      Studies/Labs Reviewed:   EKG:  EKG is not ordered today.    Recent Labs: 03/10/2018: ALT 31 03/13/2018: Magnesium 2.3 03/15/2018: BUN 9; Creatinine, Ser 1.00; Hemoglobin 13.1; Platelets 153; Potassium 4.5; Sodium 136   Lipid Panel No results found for: CHOL, TRIG, HDL, CHOLHDL, VLDL, LDLCALC, LDLDIRECT  Additional studies/ records that were reviewed today include:   Coronary CTA 12/27/2019FINDINGS: Coronary calcium score: The patient's coronary artery calcium score is 877, which places the patient in the 92nd percentile. This is greater than expected for age and gender matched peers.   Coronary arteries: Normal coronary origins.  Right dominance.   Right Coronary Artery: The ostial RCA is patent. The proximal and mid RCA are non-diagnostic due to motion artifact. Distal RCA is small caliber and patent. Patent PDA, PL.   Left Main Coronary Artery: 6.5 mm  caliber, short left main. Minimal distal mixed atherosclerotic plaque, <25% stenosis.   Left Anterior Descending Coronary Artery: Mild atherosclerotic plaque in proximal LAD 25-49% stenosis. Moderate mixed atherosclerotic plaque in the mid LAD, 50-69% stenosis. Two, possible severe calcified plaques in the mid LAD, one with circumferential calcification, 70-99% stenosis. The LAD then takes a brief intramyocardial course, with superficial myocardial bridging. Distal LAD is small caliber and patent.   Ramus intermedius: Proximal likely severe stenosis (motion artifact limits exact assessment), 70-99% stenosis.   Left Circumflex Artery: Mild atherosclerotic plaque in the proximal circumflex. Moderate narrowing at the ostium of OM2.   Aorta:  Normal size.  No calcifications.  No dissection.   Aortic Valve: No calcifications.   Other findings:   Normal pulmonary vein drainage into the left atrium.   Normal left atrial appendage without a thrombus.   Normal size of the pulmonary artery.   LVEDD 41mm.   Left atrial enlargement.   IMPRESSION: 1. The patient's coronary artery calcium score is 877, which places the patient in the 92nd percentile. This is greater than expected for age and gender matched peers.   2. Normal coronary origin with right dominance.   3. Severe CAD, CADRADS = 4. Possible severe stenosis in the mid LAD, proximal ramus intermedius. Nondiagnostic images of the RCA due to motion artifact.   CT FFR analysis will be sent.     Electronically Signed   By: Cherlynn Kaiser   On: 02/14/2018 19:38   Addended by Elouise Munroe, MD on 02/14/2018  7:40 PM   Calcium scoring 01/03/2018 CORONARY CALCIUM   Total Agatston Score: 1103   MESA database percentile:  63  FFRIMPRESSION: 1. CT FFR analysis shows severe stenosis in the mid LAD and the diagonal branch that arises adjacent to the mid LAD (likely D3). This appears to be a potential bifurcation  lesion.     Electronically Signed   By: Cherlynn Kaiser   On: 02/16/2018 18:35      Echo TEE post bypass 1/22/2020Result status: Final result     Mitral valve: Normal mitral annular size. Normal leaflet motion. Trace regurgitation.  Left Atrium: Normal size, no evidence of LAA thrombus with PWD velocities > 40 mm/s  in LAA  Left Ventricle: Normal chamber size and systolic function, LVEF 98-11%, mild hypokinesis of the inferior wall. Mild concentric LV hypertrophy.  Aortic Valve: Tricuspid valve noted, valve leaflets demonstrate normal motion with minimal sclerosis or calcification.  Right Atrium: Normal size, PA catheter noted traversing RA  Right ventricle: Normal cavity size, wall thickness and ejection fraction. No thrombus present. No mass present.  Atrial Septum: no evidence of PFO or ASD on color flow doppler.  Pulmonic Valve: No regurgitation noted.  Aorta: Minimally (Grade 1) calcified descending aorta and aortic arch, no evidence of dissection  Tricuspid Valve: Normal leaflet motion, no regurgitation noted.  Pericardium: Pericardial calcification present.       Cardiac catheterization 03/04/2018  Prox RCA lesion is 100% stenosed.  Ramus-1 lesion is 80% stenosed.  Ramus-2 lesion is 90% stenosed.  Ost 1st Diag lesion is 75% stenosed.  Prox LAD to Mid LAD lesion is 75% stenosed.  The left ventricular systolic function is normal.  LV end diastolic pressure is normal.  The left ventricular ejection fraction is 50-55% by visual estimate.  There is no aortic valve stenosis.   Severe multivessel, calcific CAD.    Plan for cardiac surgery consult as an outpatient    ASSESSMENT:    1. S/P CABG x 4   2. Ischemic cardiomyopathy   3. Essential hypertension   4. Other hyperlipidemia      PLAN:  In order of problems listed above:   Status post CABG times 4-1/22/20 by Dr. Servando Snare LVEF at time of cath 50 to 55% by visual estimate, 40 to 45% on  postop TEE.  Sent home on beta-blocker ACE inhibitor statin and aspirin.  Follow-up with Dr. Irish Lack in 2 months.  Ischemic cardiomyopathy ejection fraction 40 to 45% on postop TEE on ACE inhibitor and beta-blocker.  No symptoms of heart failure.  Low-salt diet.  Repeat echo in 3 to 4 months.  Essential hypertension controlled on losartan and metoprolol  Hyperlipidemia on Crestor.  Repeat lipids in April.  Medication Adjustments/Labs and Tests Ordered: Current medicines are reviewed at length with the patient today.  Concerns regarding medicines are outlined above.  Medication changes, Labs and Tests ordered today are listed in the Patient Instructions below. Patient Instructions  Medication Instructions:  Your physician recommends that you continue on your current medications as directed. Please refer to the Current Medication list given to you today.  If you need a refill on your cardiac medications before your next appointment, please call your pharmacy.   Lab work: Your physician recommends that you return for a FASTING lipid profile and liver function panel in April   If you have labs (blood work) drawn today and your tests are completely normal, you will receive your results only by: Marland Kitchen MyChart Message (if you have MyChart) OR . A paper copy in the mail If you have any lab test that is abnormal or we need to change your treatment, we will call you to review the results.  Testing/Procedures: Your physician has requested that you have an echocardiogram in APRIL. Echocardiography is a painless test that uses sound waves to create images of your heart. It provides your doctor with information about the size and shape of your heart and how well your heart's chambers and valves are working. This procedure takes approximately one hour. There are no restrictions for this procedure.   Follow-Up: . Follow up with Dr. Irish Lack after your echocardiogram in April  Any Other Special  Instructions Will  Be Listed Below (If Applicable).       Sumner Boast, PA-C  04/01/2018 11:30 AM    [facility] [address], [place], [place]  [address] Phone: [phone]; Fax: [phone]

## 2018-04-01 ENCOUNTER — Encounter: Payer: Self-pay | Admitting: Physician Assistant

## 2018-04-01 ENCOUNTER — Ambulatory Visit (INDEPENDENT_AMBULATORY_CARE_PROVIDER_SITE_OTHER): Payer: PRIVATE HEALTH INSURANCE | Admitting: Physician Assistant

## 2018-04-01 VITALS — BP 128/82 | HR 72 | Ht 71.5 in | Wt 193.8 lb

## 2018-04-01 DIAGNOSIS — E785 Hyperlipidemia, unspecified: Secondary | ICD-10-CM | POA: Insufficient documentation

## 2018-04-01 DIAGNOSIS — I10 Essential (primary) hypertension: Secondary | ICD-10-CM | POA: Diagnosis not present

## 2018-04-01 DIAGNOSIS — Z951 Presence of aortocoronary bypass graft: Secondary | ICD-10-CM | POA: Diagnosis not present

## 2018-04-01 DIAGNOSIS — I255 Ischemic cardiomyopathy: Secondary | ICD-10-CM | POA: Diagnosis not present

## 2018-04-01 DIAGNOSIS — E7849 Other hyperlipidemia: Secondary | ICD-10-CM | POA: Diagnosis not present

## 2018-04-01 NOTE — Patient Instructions (Signed)
Medication Instructions:  Your physician recommends that you continue on your current medications as directed. Please refer to the Current Medication list given to you today.  If you need a refill on your cardiac medications before your next appointment, please call your pharmacy.   Lab work: Your physician recommends that you return for a FASTING lipid profile and liver function panel in April   If you have labs (blood work) drawn today and your tests are completely normal, you will receive your results only by: Marland Kitchen MyChart Message (if you have MyChart) OR . A paper copy in the mail If you have any lab test that is abnormal or we need to change your treatment, we will call you to review the results.  Testing/Procedures: Your physician has requested that you have an echocardiogram in APRIL. Echocardiography is a painless test that uses sound waves to create images of your heart. It provides your doctor with information about the size and shape of your heart and how well your heart's chambers and valves are working. This procedure takes approximately one hour. There are no restrictions for this procedure.   Follow-Up: . Follow up with Dr. Irish Lack after your echocardiogram in April  Any Other Special Instructions Will Be Listed Below (If Applicable).

## 2018-04-04 ENCOUNTER — Other Ambulatory Visit: Payer: Self-pay | Admitting: Cardiothoracic Surgery

## 2018-04-04 DIAGNOSIS — Z951 Presence of aortocoronary bypass graft: Secondary | ICD-10-CM

## 2018-04-07 ENCOUNTER — Ambulatory Visit
Admission: RE | Admit: 2018-04-07 | Discharge: 2018-04-07 | Disposition: A | Discharge status: Home / Self Care | Payer: PRIVATE HEALTH INSURANCE | Source: Ambulatory Visit | Attending: Cardiothoracic Surgery | Admitting: Cardiothoracic Surgery

## 2018-04-07 ENCOUNTER — Ambulatory Visit (INDEPENDENT_AMBULATORY_CARE_PROVIDER_SITE_OTHER): Payer: Self-pay | Admitting: Physician Assistant

## 2018-04-07 VITALS — BP 114/70 | HR 76 | Resp 20 | Ht 71.5 in | Wt 192.0 lb

## 2018-04-07 DIAGNOSIS — Z951 Presence of aortocoronary bypass graft: Secondary | ICD-10-CM

## 2018-04-07 DIAGNOSIS — I2510 Atherosclerotic heart disease of native coronary artery without angina pectoris: Secondary | ICD-10-CM

## 2018-04-07 MED ORDER — METOPROLOL TARTRATE 25 MG PO TABS: 25.0000 mg | ORAL_TABLET | Freq: Two times a day (BID) | ORAL | 3 refills | Status: DC

## 2018-04-07 MED ORDER — LOSARTAN POTASSIUM 25 MG PO TABS: 25.0000 mg | ORAL_TABLET | Freq: Every day | ORAL | 3 refills | Status: DC

## 2018-04-07 NOTE — Patient Instructions (Signed)
Make every effort to maintain a "heart-healthy" lifestyle with regular physical exercise and adherence to a low-fat, low-carbohydrate diet.  Continue to seek regular follow-up appointments with your primary care physician and/or cardiologist.  You may continue to gradually increase your physical activity as tolerated.  Refrain from any heavy lifting or strenuous use of your arms and shoulders until at least 8 weeks from the time of your surgery, and avoid activities that cause increased pain in your chest on the side of your surgical incision.  Otherwise you may continue to increase activities without any particular limitations.  Increase the intensity and duration of physical activity gradually.

## 2018-04-07 NOTE — Progress Notes (Signed)
HPI: Patient returns for routine postoperative follow-up having undergone CABG x 4 on 03/12/2018.  The patient's early postoperative recovery while in the hospital was unremarkable and he progressed without complication.  Since hospital discharge the patient reports that he is doing well.  He has some residual numbness along his left breast region.  He has not other chest discomfort.  He has already started working out at [employer] and is up to 45 min, with HR in the 130s.  He is anxious to get back to work.  His incisions are healing well.  Current Outpatient Medications  Medication Sig Dispense Refill  . acetaminophen (TYLENOL) 500 MG tablet Take 2 tablets (1,000 mg total) by mouth every 6 (six) hours as needed. 30 tablet 0  . aspirin EC 325 MG EC tablet Take 1 tablet (325 mg total) by mouth daily. 30 tablet 0  . co-enzyme Q-10 30 MG capsule Take 20 mg by mouth daily.    Marland Kitchen losartan (COZAAR) 25 MG tablet Take 1 tablet (25 mg total) by mouth daily. 30 tablet 3  . metoprolol tartrate (LOPRESSOR) 25 MG tablet Take 1 tablet (25 mg total) by mouth 2 (two) times daily. 60 tablet 3  . rosuvastatin (CRESTOR) 20 MG tablet Take 20 mg by mouth daily.    . traMADol (ULTRAM) 50 MG tablet Take 1-2 tablets (50-100 mg total) by mouth every 4 (four) hours as needed for moderate pain. 30 tablet 0   No current facility-administered medications for this visit.     Physical Exam:  BP 114/70   Pulse 76   Resp 20   Ht 5' 11.5" (1.816 m)   Wt 192 lb (87.1 kg)   SpO2 96% Comment: RA  BMI 26.41 kg/m   Gen: no apparent distress Heart: RRR Lungs: CTA bilaterally Ext: no edema present Incisions: well healed  Diagnostic Tests:  CXR; no pleural effusions, sternal wires intact, no pneumothorax  A/P:  1. S/P CABG x 4 doing well, maintaining NSR, BP is well controlled- provided refills for Lopressor and Losartan 2. Activity- has already resumed driving, increase activity level as tolerated. Okay to increase  cardiac activity with HR not going above 130-140s.  Avoid strenuous activity and lifting more than 10-15 lbs for at least 8 weeks post operatively, RTW arrangements have been completed 3. Cardiac rehab- patient will work out at [employer], will not participate 4. RTC on 3/26 with EBG prior to resuming surgical schedule  Ellwood Handler, PA-C [facility] [phone]

## 2018-05-15 ENCOUNTER — Ambulatory Visit: Payer: PRIVATE HEALTH INSURANCE | Admitting: Cardiothoracic Surgery

## 2018-05-29 ENCOUNTER — Ambulatory Visit (HOSPITAL_COMMUNITY): Payer: PRIVATE HEALTH INSURANCE

## 2018-05-29 ENCOUNTER — Other Ambulatory Visit: Payer: PRIVATE HEALTH INSURANCE

## 2018-06-05 ENCOUNTER — Ambulatory Visit: Payer: PRIVATE HEALTH INSURANCE | Admitting: Interventional Cardiology

## 2018-07-30 ENCOUNTER — Telehealth (HOSPITAL_COMMUNITY): Payer: Self-pay | Admitting: Radiology

## 2018-07-30 NOTE — Telephone Encounter (Signed)

## 2018-07-31 ENCOUNTER — Other Ambulatory Visit: Payer: Self-pay

## 2018-07-31 ENCOUNTER — Other Ambulatory Visit: Payer: PRIVATE HEALTH INSURANCE | Admitting: *Deleted

## 2018-07-31 ENCOUNTER — Ambulatory Visit (HOSPITAL_COMMUNITY): Payer: PRIVATE HEALTH INSURANCE | Attending: Cardiovascular Disease

## 2018-07-31 DIAGNOSIS — I255 Ischemic cardiomyopathy: Secondary | ICD-10-CM | POA: Diagnosis not present

## 2018-07-31 DIAGNOSIS — Z951 Presence of aortocoronary bypass graft: Secondary | ICD-10-CM

## 2018-07-31 LAB — HEPATIC FUNCTION PANEL
ALT: 25 IU/L (ref 0–44)
AST: 29 IU/L (ref 0–40)
Albumin: 4.6 g/dL (ref 3.8–4.8)
Alkaline Phosphatase: 86 IU/L (ref 39–117)
Bilirubin Total: 0.7 mg/dL (ref 0.0–1.2)
Bilirubin, Direct: 0.21 mg/dL (ref 0.00–0.40)
Total Protein: 6.7 g/dL (ref 6.0–8.5)

## 2018-07-31 LAB — LIPID PANEL
Chol/HDL Ratio: 1.9 ratio (ref 0.0–5.0)
Cholesterol, Total: 117 mg/dL (ref 100–199)
HDL: 61 mg/dL (ref >39)
LDL Calculated: 46 mg/dL (ref 0–99)
Triglycerides: 52 mg/dL (ref 0–149)
VLDL Cholesterol Cal: 10 mg/dL (ref 5–40)

## 2018-10-16 NOTE — Progress Notes (Signed)
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    In 2019, Patient underwent CABG x4 with LIMA to the LAD, sequential SVG to intermediate and diagonal, SVG to PDA by Dr. Servando Snare.  Intra-Op TEE EF 40 to 45% with inferior wall mild hypokinesis, mild LVH.   At last visit, it was noted that He walks 3.2 miles through the [place] with his daughter without difficulty.  He is exercising daily and feels great.  Has kept a regular log of his blood pressures and they are well controlled.  No chest pain or shortness of breath.  No edema.  Since his CABG, he felt like he could do a little more on the elliptical.   His diet has been healthy.  Less dairy and red meat.    Repeat showed an improved EF post CABG.  Reports PVCs picked up on his Cardia machine, no symptoms associated.  Happens more in the evening when his metoprolol is wearing off.   Denies : Chest pain. Dizziness. Leg edema. Nitroglycerin use. Orthopnea. Palpitations. Paroxysmal nocturnal dyspnea. Shortness of breath. Syncope.     Past Medical History:  Diagnosis Date  . Adenomatous polyp of colon   . BPH with urinary obstruction   . Coronary artery disease   . High frequency hearing loss   . History of kidney stones   . Hx of colonoscopy   . Hyperlipemia   . Impacted cerumen of both ears   . Kidney stones   . Nephrolithiasis   . Tuberculosis     Past Surgical History:  Procedure Laterality Date  . CORONARY ARTERY BYPASS GRAFT N/A 03/12/2018   Procedure: CORONARY ARTERY BYPASS GRAFTING (CABG) x four , using left internal mammary artery to LAD and sequential vein  graft  to intermediate and diagonal and vein graft to posterior descending.   right thigh  greater saphenous endovein harvest;  Surgeon: Grace Isaac, MD;  Location: [place];  Service: Open Heart Surgery;  Laterality: N/A;  . CYSTOSCOPY WITH RETROGRADE PYELOGRAM, URETEROSCOPY AND STENT PLACEMENT Bilateral 10/16/2013   Procedure: CYSTOSCOPY WITH BILATERAL RETROGRADE PYELOGRAM, BILATERAL URETEROSCOPY AND bilateral STENT PLACEMENT, bilateral urethral dilation HOLMIUM LASER;  Surgeon: Claybon Jabs, MD;  Location: [facility];  Service: Urology;  Laterality: Bilateral;  . HOLMIUM LASER APPLICATION Bilateral [id number]   Procedure: HOLMIUM LASER APPLICATION;  Surgeon: Claybon Jabs, MD;  Location: [facility];  Service: Urology;  Laterality: Bilateral;  . LEFT HEART CATH AND CORONARY ANGIOGRAPHY N/A 03/04/2018   Procedure: LEFT HEART CATH AND CORONARY ANGIOGRAPHY;  Surgeon: Jettie Booze, MD;  Location: [place] [facility];  Service: Cardiovascular;  Laterality: N/A;  . TEE WITHOUT CARDIOVERSION N/A 03/12/2018   Procedure: TRANSESOPHAGEAL ECHOCARDIOGRAM (TEE);  Surgeon: Grace Isaac, MD;  Location: [place];  Service: Open Heart Surgery;  Laterality: N/A;     Current Outpatient Medications  Medication Sig Dispense Refill  . aspirin EC 81 MG tablet Take 81 mg by mouth daily.    Marland Kitchen co-enzyme Q-10  30 MG capsule Take 20 mg by mouth daily.    Marland Kitchen losartan (COZAAR) 25 MG tablet Take 1 tablet (25 mg total) by mouth daily. 90 tablet 3  . metoprolol tartrate (LOPRESSOR) 25 MG tablet Take 1 tablet (25 mg total) by mouth 2 (two) times daily. 180 tablet 3  . rosuvastatin (CRESTOR) 20 MG tablet Take 20 mg by mouth daily.     No current facility-administered medications for this visit.     Allergies:   Oxycodone    Social History:  The patient  reports that he has quit smoking. His smoking use included cigarettes. He smoked 0.25 packs per day. He has never used smokeless tobacco. He reports current alcohol use  of about 7.0 standard drinks of alcohol per week. He reports that he does not use drugs.   Family History:  The patient's family history includes AAA (abdominal aortic aneurysm) in his maternal grandfather; Alcohol abuse in his maternal grandmother, mother, and paternal grandfather; Alzheimer's disease in his mother; Breast cancer in his paternal grandmother; Colon polyps in his father; HIV in his brother; Healthy in his daughter, daughter, grandchild, and son; Heart Problems in his father and maternal grandfather; Hypertension in his mother; Prostate cancer in his father.    ROS:  Please see the history of present illness.   Otherwise, review of systems are positive for occasional PVCs, worse in the evening..   All other systems are reviewed and negative.    PHYSICAL EXAM: VS:  BP 110/60   Pulse (!) 56   Ht 5' 11.5" (1.816 m)   Wt 195 lb 12.8 oz (88.8 kg)   SpO2 96%   BMI 26.93 kg/m  , BMI Body mass index is 26.93 kg/m. GEN: Well nourished, well developed, in no acute distress  HEENT: normal  Neck: no JVD, carotid bruits, or masses Cardiac: RRR; no murmurs, rubs, or gallops,no edema, occasional premature neats  Respiratory:  clear to auscultation bilaterally, normal work of breathing GI: soft, nontender, nondistended, + BS MS: no deformity or atrophy  Skin: warm and dry, no rash Neuro:  Strength and sensation are intact Psych: euthymic mood, full affect   EKG:   The ekg ordered today demonstrates from his CArdia machine shows NSR, PVCs   Recent Labs: 03/13/2018: Magnesium 2.3 03/15/2018: BUN 9; Creatinine, Ser 1.00; Hemoglobin 13.1; Platelets 153; Potassium 4.5; Sodium 136 07/31/2018: ALT 25   Lipid Panel    Component Value Date/Time   CHOL 117 07/31/2018 0000   TRIG 52 07/31/2018 0000   HDL 61 07/31/2018 0000   CHOLHDL 1.9 07/31/2018 0000   LDLCALC 46 07/31/2018 0000     Other studies Reviewed: Additional studies/ records that were reviewed today with results  demonstrating: echo reviewed.   ASSESSMENT AND PLAN:  1. CAD: s/p CABG.  No angina.  Continue aggressive secondary prevention.  2. Ischemic cardiomyopathy: Appears euvolemic.  3. HTN: The current medical regimen is effective;  continue present plan and medications. 4. Hyperlipidemia: Lipids well controlled on Crestor.   5. PVCs: If he sees that they are becoming more frequent, can consider 24 hr Holter monitor to better quantify.    Current medicines are reviewed at length with the patient today.  The patient concerns regarding his medicines were addressed.  The following changes have been made:  No change  Labs/ tests ordered today include:  No orders of the defined types were placed in this encounter.   Recommend 150 minutes/week of aerobic exercise Low fat, low carb,  high fiber diet recommended  Disposition:   FU in 1 year   Signed, Larae Grooms, MD  10/17/2018 4:30 PM    [facility], [place], [place]  [address] Phone: [phone]; Fax: [phone]

## 2018-10-17 ENCOUNTER — Ambulatory Visit (INDEPENDENT_AMBULATORY_CARE_PROVIDER_SITE_OTHER): Payer: PRIVATE HEALTH INSURANCE | Admitting: Interventional Cardiology

## 2018-10-17 ENCOUNTER — Encounter: Payer: Self-pay | Admitting: Interventional Cardiology

## 2018-10-17 VITALS — BP 110/60 | HR 56 | Ht 71.5 in | Wt 195.8 lb

## 2018-10-17 DIAGNOSIS — I255 Ischemic cardiomyopathy: Secondary | ICD-10-CM

## 2018-10-17 DIAGNOSIS — E7849 Other hyperlipidemia: Secondary | ICD-10-CM

## 2018-10-17 DIAGNOSIS — I25118 Atherosclerotic heart disease of native coronary artery with other forms of angina pectoris: Secondary | ICD-10-CM

## 2018-10-17 DIAGNOSIS — Z951 Presence of aortocoronary bypass graft: Secondary | ICD-10-CM

## 2018-10-17 DIAGNOSIS — I493 Ventricular premature depolarization: Secondary | ICD-10-CM

## 2018-10-17 DIAGNOSIS — I10 Essential (primary) hypertension: Secondary | ICD-10-CM

## 2018-10-17 MED ORDER — ROSUVASTATIN CALCIUM 20 MG PO TABS: 20.0000 mg | ORAL_TABLET | Freq: Every day | ORAL | 3 refills | Status: DC

## 2018-10-17 MED ORDER — METOPROLOL TARTRATE 25 MG PO TABS: 25.0000 mg | ORAL_TABLET | Freq: Two times a day (BID) | ORAL | 3 refills | Status: DC

## 2018-10-17 MED ORDER — LOSARTAN POTASSIUM 25 MG PO TABS: 25.0000 mg | ORAL_TABLET | Freq: Every day | ORAL | 3 refills | Status: DC

## 2018-10-17 NOTE — Patient Instructions (Addendum)
Medication Instructions:  Your physician recommends that you continue on your current medications as directed. Please refer to the Current Medication list given to you today.  If you need a refill on your cardiac medications before your next appointment, please call your pharmacy.   Lab work: None Ordered If you have labs (blood work) drawn today and your tests are completely normal, you will receive your results only by: Marland Kitchen MyChart Message (if you have MyChart) OR . A paper copy in the mail If you have any lab test that is abnormal or we need to change your treatment, we will call you to review the results.  Testing/Procedures: None Ordered   Follow-Up: At [facility], you and your health needs are our priority.  As part of our continuing mission to provide you with exceptional heart care, we have created designated Provider Care Teams.  These Care Teams include your primary Cardiologist (physician) and Advanced Practice Providers (APPs -  Physician Assistants and Nurse Practitioners) who all work together to provide you with the care you need, when you need it. You will need a follow up appointment in 6 months.  Please call our office 2 months in advance to schedule this appointment.  You may see Larae Grooms, MD   Any Other Special Instructions Will Be Listed Below (If Applicable). Call office if you start experiencing more PVC's

## 2018-11-10 ENCOUNTER — Other Ambulatory Visit: Payer: Self-pay | Admitting: Physician Assistant
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
## 2019-04-15 ENCOUNTER — Encounter: Payer: Self-pay | Admitting: Interventional Cardiology

## 2019-04-15 ENCOUNTER — Other Ambulatory Visit: Payer: Self-pay

## 2019-04-15 ENCOUNTER — Ambulatory Visit: Payer: PRIVATE HEALTH INSURANCE | Admitting: Interventional Cardiology

## 2019-04-15 ENCOUNTER — Telehealth: Payer: Self-pay | Admitting: Radiology

## 2019-04-15 VITALS — BP 116/62 | HR 55 | Ht 71.5 in | Wt 203.2 lb

## 2019-04-15 DIAGNOSIS — Z951 Presence of aortocoronary bypass graft: Secondary | ICD-10-CM

## 2019-04-15 DIAGNOSIS — I493 Ventricular premature depolarization: Secondary | ICD-10-CM

## 2019-04-15 DIAGNOSIS — I255 Ischemic cardiomyopathy: Secondary | ICD-10-CM | POA: Diagnosis not present

## 2019-04-15 DIAGNOSIS — E7849 Other hyperlipidemia: Secondary | ICD-10-CM

## 2019-04-15 DIAGNOSIS — I25118 Atherosclerotic heart disease of native coronary artery with other forms of angina pectoris: Secondary | ICD-10-CM

## 2019-04-15 DIAGNOSIS — I10 Essential (primary) hypertension: Secondary | ICD-10-CM

## 2019-04-15 NOTE — Telephone Encounter (Signed)
Enrolled patient for a 14 day Zio monitor to be mailed to patients home.  

## 2019-04-15 NOTE — Patient Instructions (Signed)
Medication Instructions:  Your physician recommends that you continue on your current medications as directed. Please refer to the Current Medication list given to you today.  *If you need a refill on your cardiac medications before your next appointment, please call your pharmacy*  Lab Work: None ordered  If you have labs (blood work) drawn today and your tests are completely normal, you will receive your results only by: Marland Kitchen MyChart Message (if you have MyChart) OR . A paper copy in the mail If you have any lab test that is abnormal or we need to change your treatment, we will call you to review the results.  Testing/Procedures: Your physician has recommended that you wear a 14 day monitor. These monitors are medical devices that record the heart's electrical activity. Doctors most often use these monitors to diagnose arrhythmias. Arrhythmias are problems with the speed or rhythm of the heartbeat. The monitor is a small, portable device. You can wear one while you do your normal daily activities. This is usually used to diagnose what is causing palpitations/syncope (passing out).  Follow-Up: At [facility], you and your health needs are our priority.  As part of our continuing mission to provide you with exceptional heart care, we have created designated Provider Care Teams.  These Care Teams include your primary Cardiologist (physician) and Advanced Practice Providers (APPs -  Physician Assistants and Nurse Practitioners) who all work together to provide you with the care you need, when you need it.  Your next appointment:   6 month(s)  The format for your next appointment:   In Person  Provider:   You may see Larae Grooms, MD or one of the following Advanced Practice Providers on your designated Care Team:    Melina Copa, PA-C  Ermalinda Barrios, PA-C   Other Instructions Bryn Gulling- Long Term Monitor Instructions   Your physician has requested you wear your ZIO patch monitor 14  days.   This is a single patch monitor.  [employer] supplies one patch monitor per enrollment.  Additional stickers are not available.   Please do not apply patch if you will be having a Nuclear Stress Test, Echocardiogram, Cardiac CT, MRI, or Chest Xray during the time frame you would be wearing the monitor. The patch cannot be worn during these tests.  You cannot remove and re-apply the ZIO XT patch monitor.   Your ZIO patch monitor will be sent USPS Priority mail from [facility] directly to your home address. The monitor may also be mailed to a PO BOX if home delivery is not available.   It may take 3-5 days to receive your monitor after you have been enrolled.   Once you have received you monitor, please review enclosed instructions.  Your monitor has already been registered assigning a specific monitor serial # to you.   Applying the monitor   Shave hair from upper left chest.   Hold abrader disc by orange tab.  Rub abrader in 40 strokes over left upper chest as indicated in your monitor instructions.   Clean area with 4 enclosed alcohol pads .  Use all pads to assure are is cleaned thoroughly.  Let dry.   Apply patch as indicated in monitor instructions.  Patch will be place under collarbone on left side of chest with arrow pointing upward.   Rub patch adhesive wings for 2 minutes.Remove white label marked "1".  Remove white label marked "2".  Rub patch adhesive wings for 2 additional minutes.  While looking in a mirror, press and release button in center of patch.  A small green light will flash 3-4 times .  This will be your only indicator the monitor has been turned on.     Do not shower for the first 24 hours.  You may shower after the first 24 hours.   Press button if you feel a symptom. You will hear a small click.  Record Date, Time and Symptom in the Patient Log Book.   When you are ready to remove patch, follow instructions on last 2 pages of Patient Log Book.   Stick patch monitor onto last page of Patient Log Book.   Place Patient Log Book in Commodore box.  Use locking tab on box and tape box closed securely.  The [employer] has [employer] on it.  Please place in mailbox as soon as possible.  Your physician should have your test results approximately 7 days after the monitor has been mailed back to [facility].   Call [place] at [phone] if you have questions regarding your ZIO XT patch monitor.  Call them immediately if you see an orange light blinking on your monitor.   If your monitor falls off in less than 4 days contact our Monitor department at [phone].  If your monitor becomes loose or falls off after 4 days call [employer] at [phone] for suggestions on securing your monitor.

## 2019-04-18 ENCOUNTER — Ambulatory Visit (INDEPENDENT_AMBULATORY_CARE_PROVIDER_SITE_OTHER): Payer: PRIVATE HEALTH INSURANCE

## 2019-04-18 DIAGNOSIS — I493 Ventricular premature depolarization: Secondary | ICD-10-CM
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
## 2019-10-15 ENCOUNTER — Other Ambulatory Visit: Payer: Self-pay

## 2019-10-15 ENCOUNTER — Encounter: Payer: Self-pay | Admitting: Interventional Cardiology

## 2019-10-15 ENCOUNTER — Ambulatory Visit: Payer: PRIVATE HEALTH INSURANCE | Admitting: Interventional Cardiology

## 2019-10-15 VITALS — BP 114/62 | HR 46 | Ht 71.5 in | Wt 190.0 lb

## 2019-10-15 DIAGNOSIS — I493 Ventricular premature depolarization: Secondary | ICD-10-CM | POA: Diagnosis not present

## 2019-10-15 DIAGNOSIS — E7849 Other hyperlipidemia: Secondary | ICD-10-CM

## 2019-10-15 DIAGNOSIS — I10 Essential (primary) hypertension: Secondary | ICD-10-CM

## 2019-10-15 DIAGNOSIS — I25118 Atherosclerotic heart disease of native coronary artery with other forms of angina pectoris: Secondary | ICD-10-CM

## 2019-10-15 DIAGNOSIS — Z951 Presence of aortocoronary bypass graft: Secondary | ICD-10-CM | POA: Diagnosis not present

## 2019-10-15 NOTE — Patient Instructions (Signed)

## 2019-12-12 IMAGING — CR DG CHEST 2V
2 series · 2 of 2 positions shown · non-contrast
Comparison: None.

CLINICAL DATA: Coronary artery disease with upcoming coronary
bypass grafting, preoperative testing

EXAM:
CHEST - 2 VIEW

[w chest pa]
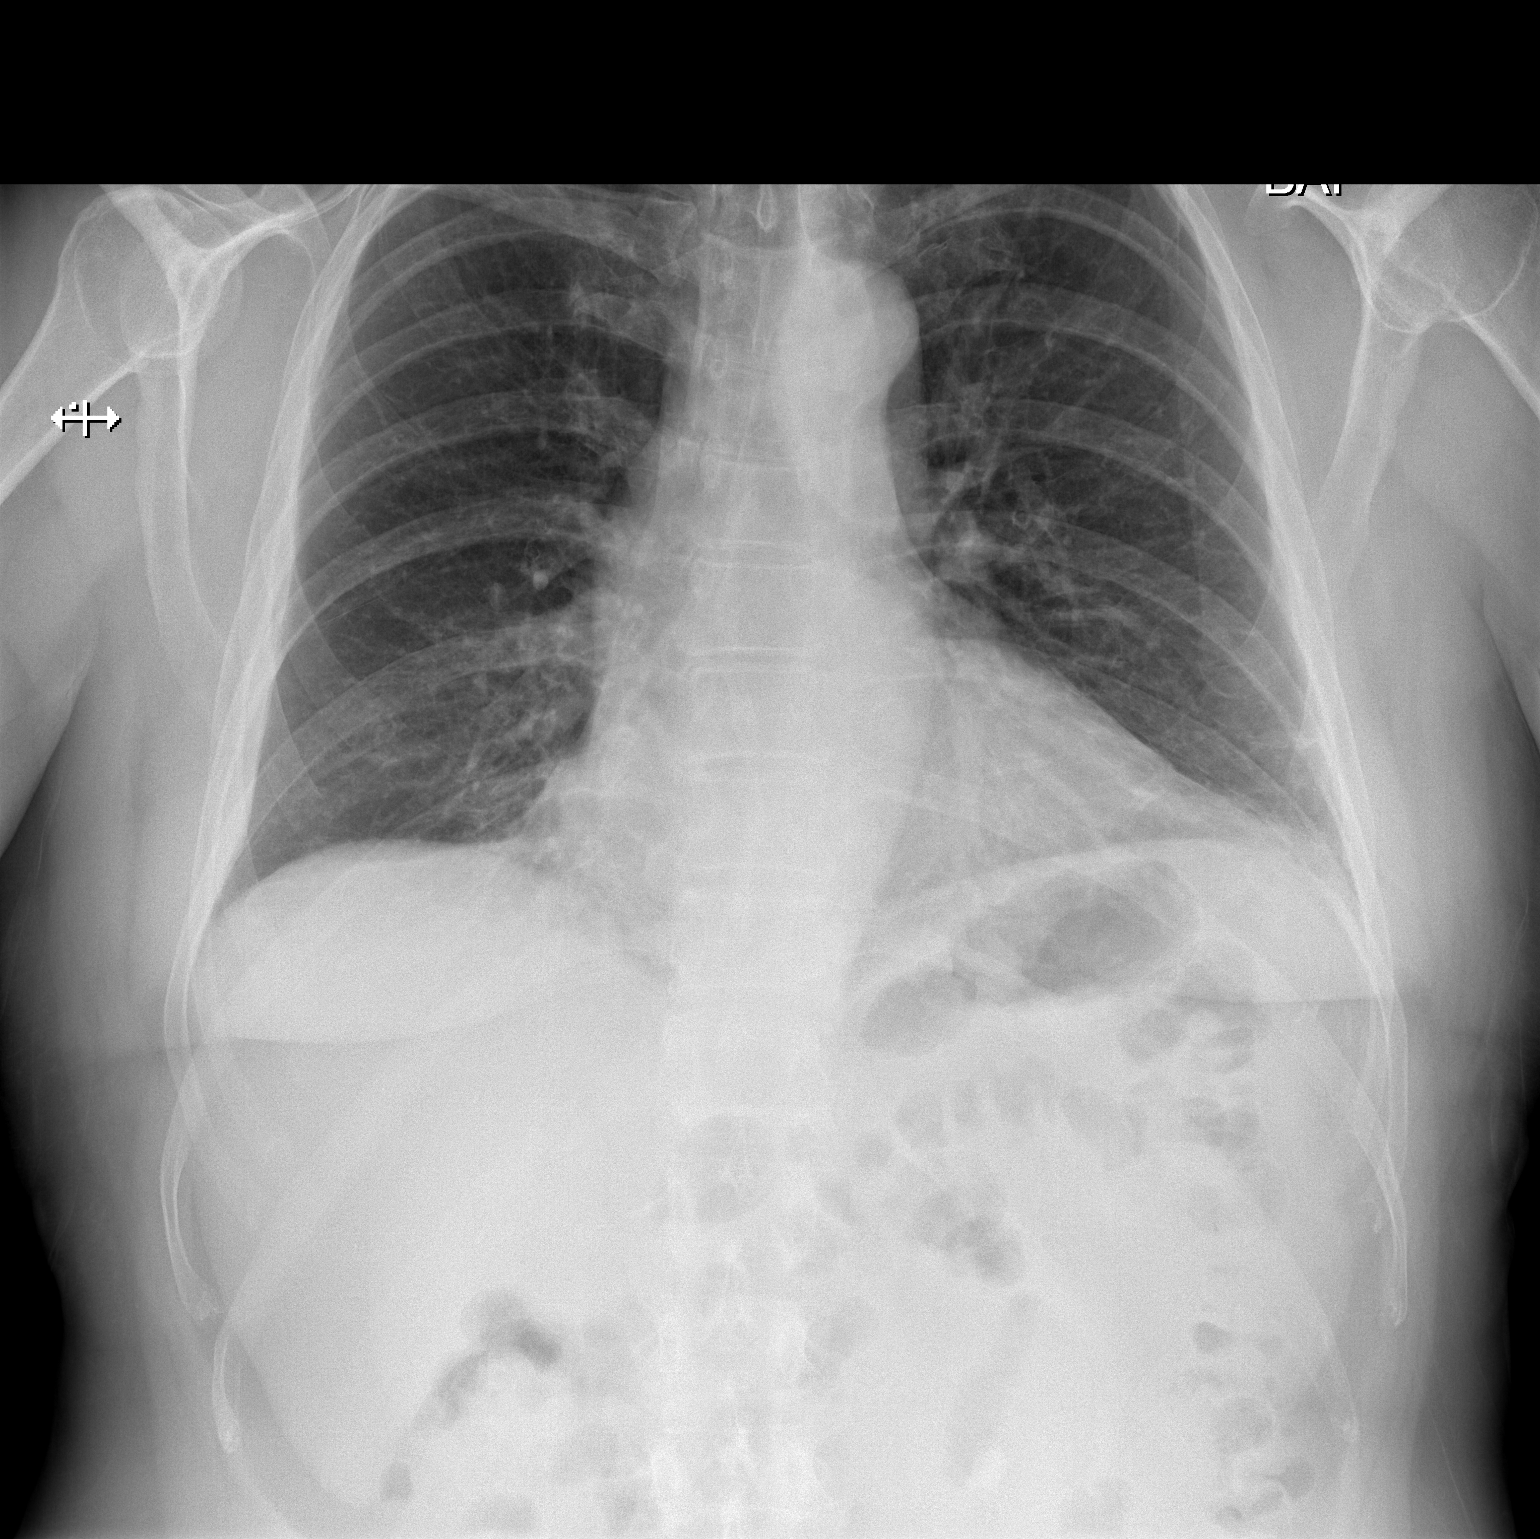

[w chest lat]
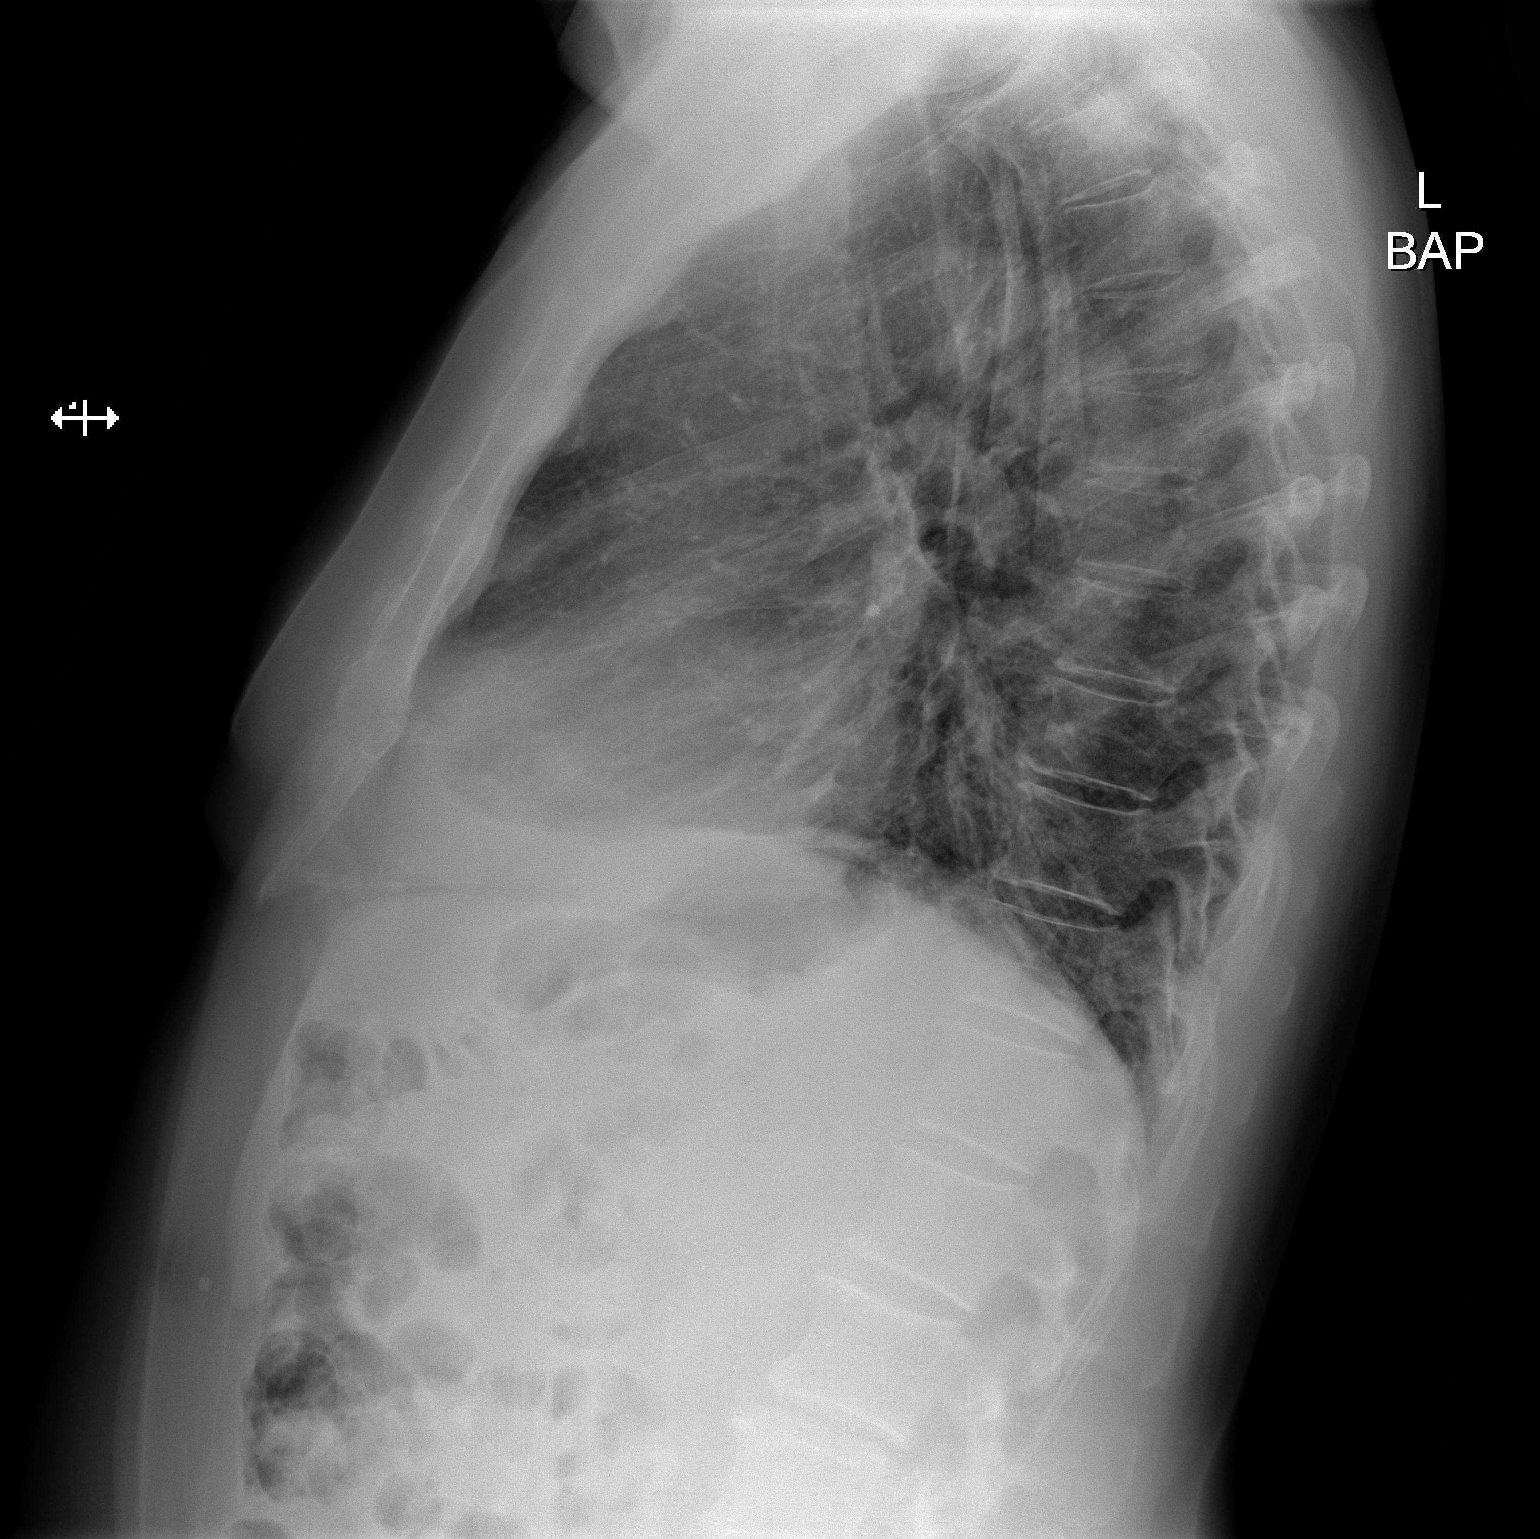

[2 of 2 positions shown; findings below may reference images not displayed]

FINDINGS: Cardiac shadow is at the upper limits of normal in size. The lungs
are clear bilaterally. Minimal left basilar atelectasis is noted. No
acute bony abnormality is noted.
IMPRESSION: Mild left basilar atelectasis.

## 2019-12-14 IMAGING — DX DG CHEST 1V PORT
1 series · 1 of 1 positions shown · non-contrast
Comparison: 03/10/2018

CLINICAL DATA: Status post CABG.

EXAM:
PORTABLE CHEST 1 VIEW

[chest]
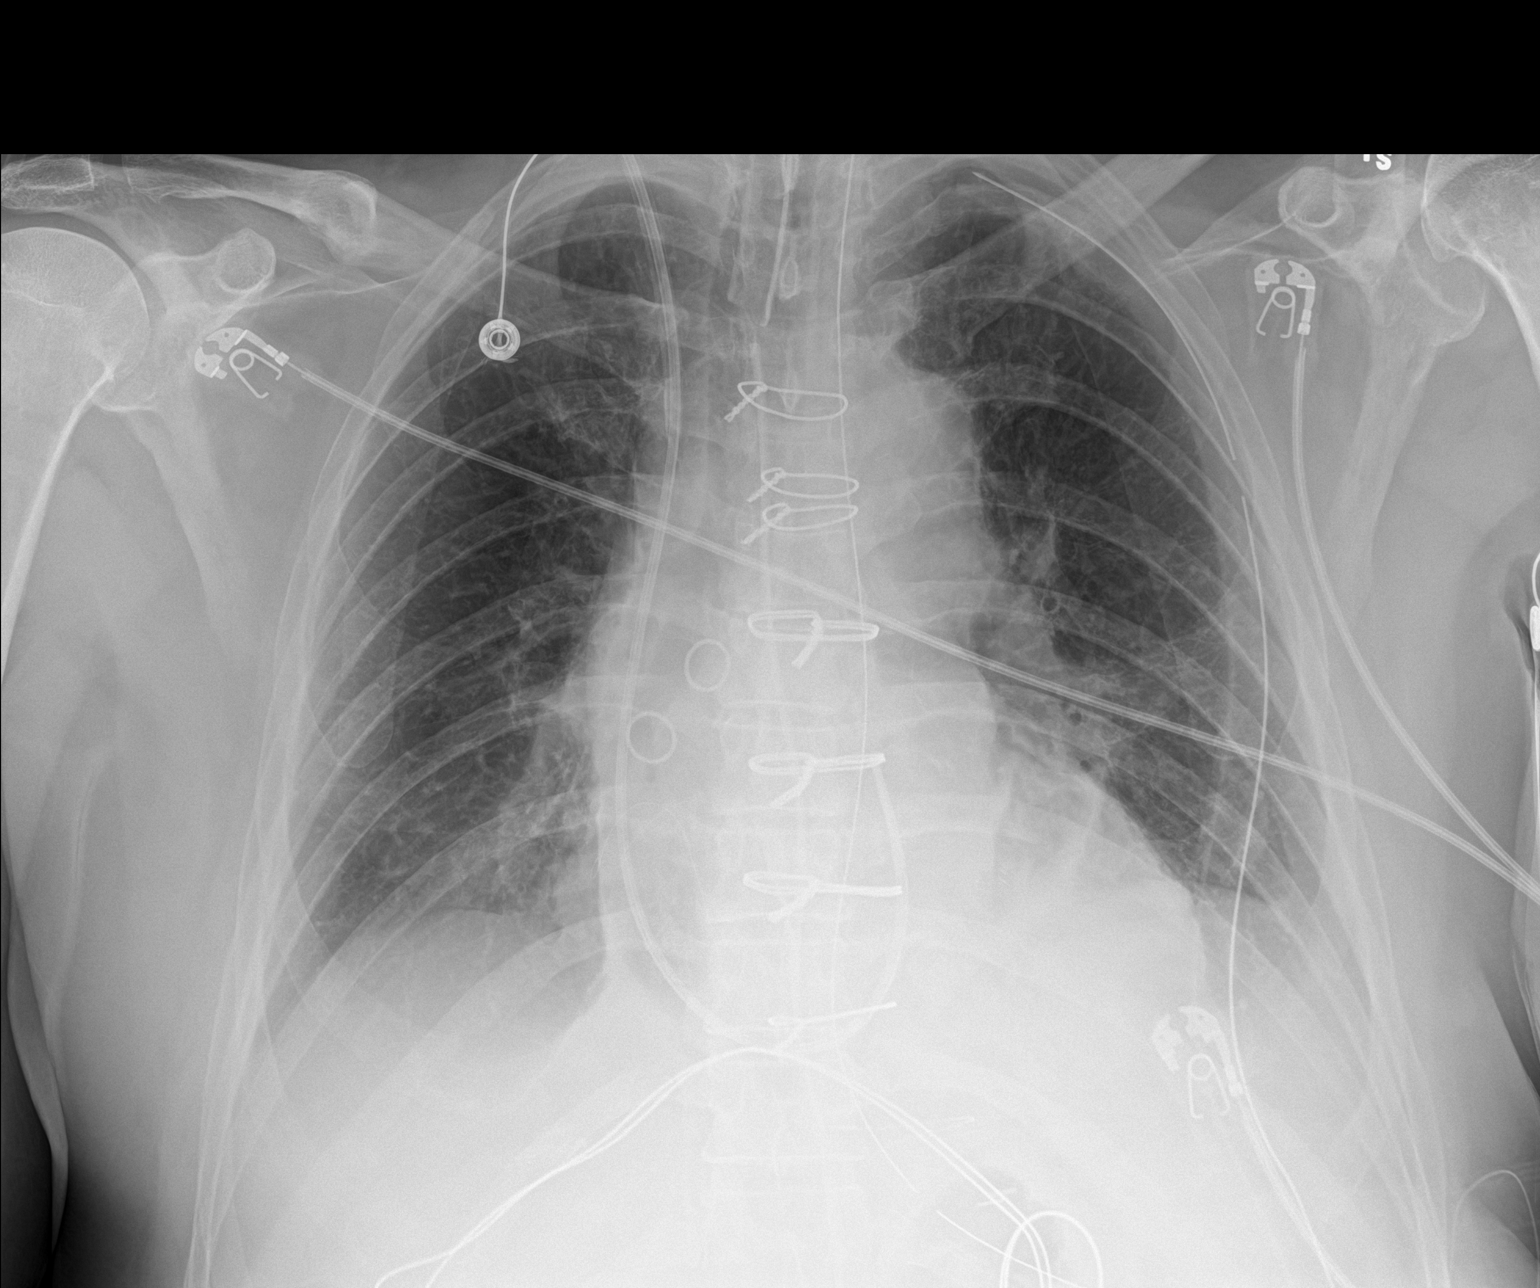

[1 of 1 positions shown; findings below may reference images not displayed]

FINDINGS: Swan-Ganz catheter tip projects over the right ventricular outflow
tract. ET tube tip is above the carina. Enteric tube tip is below
the GE junction. Mediastinal drain and left chest tube in place. No
significant pneumothorax identified. Heart size appears normal.
There is subsegmental atelectasis in the left midlung and left base.
No pulmonary edema.
IMPRESSION: 1. Support apparatus positioned as above.
2. Left chest tube in place without pneumothorax.
3. Left midlung and left base subsegmental atelectasis.

## 2019-12-16 IMAGING — DX DG CHEST 1V PORT
1 series · 1 of 1 positions shown · non-contrast
Comparison: March 13, 2018

CLINICAL DATA: Chest pain

EXAM:
PORTABLE CHEST 1 VIEW

[chest ap]
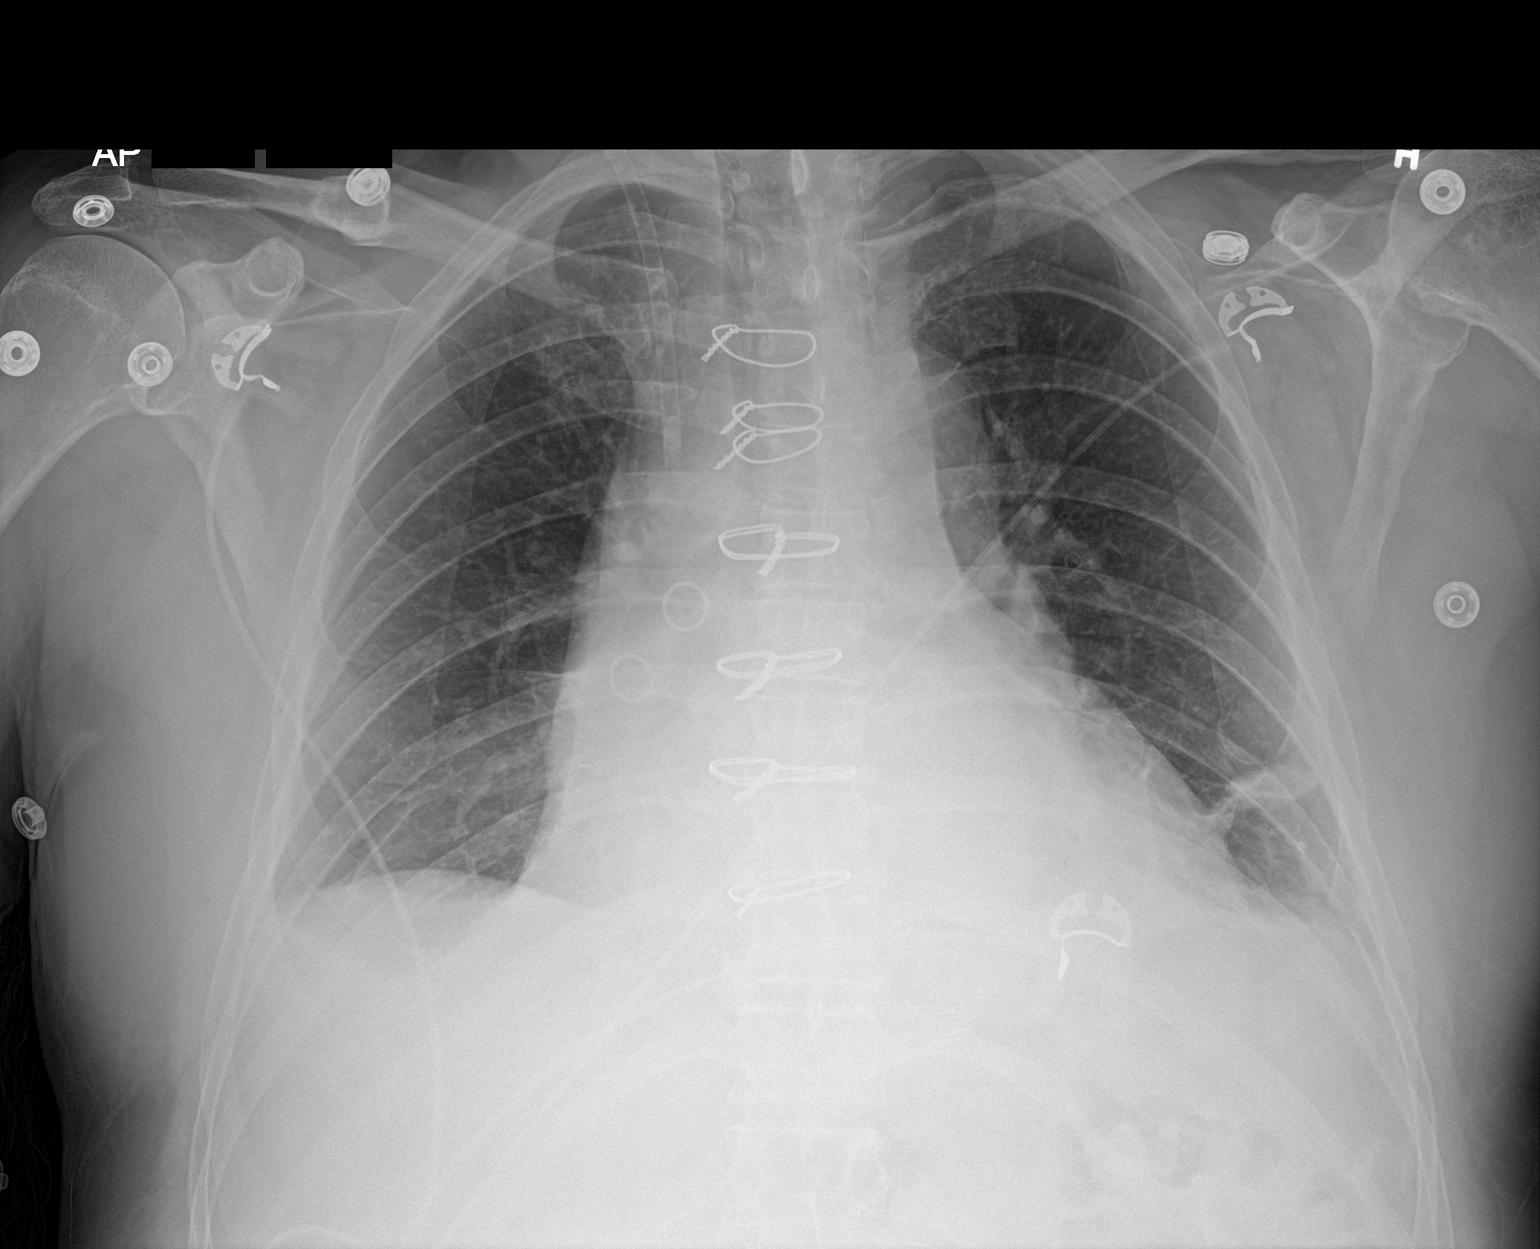

[1 of 1 positions shown; findings below may reference images not displayed]

FINDINGS: Left chest tube and mediastinal drain have been removed. Swan-Ganz
catheter is been removed with Cordis tip remaining in the superior
vena cava. Temporary pacemaker wires remain attached to the right
heart. There is no appreciable pneumothorax. There are minimal
pleural effusions bilaterally with atelectatic change in the left
base. The lungs elsewhere are clear. Heart is mildly enlarged,
stable, with pulmonary vascularity normal. Status post coronary
artery bypass grafting. No adenopathy. No bone lesions.
IMPRESSION: No pneumothorax. Cordis tip in superior vena cava. Atelectatic
change left base with minimal pleural effusions bilaterally. No
consolidation. Stable cardiac prominence.

## 2019-12-17 IMAGING — CR DG CHEST 2V
2 series · 2 of 2 positions shown · non-contrast
Comparison: Chest x-ray dated 03/14/2018.

CLINICAL DATA: CABG x4 3 days ago.

EXAM:
CHEST - 2 VIEW

[chest pa]
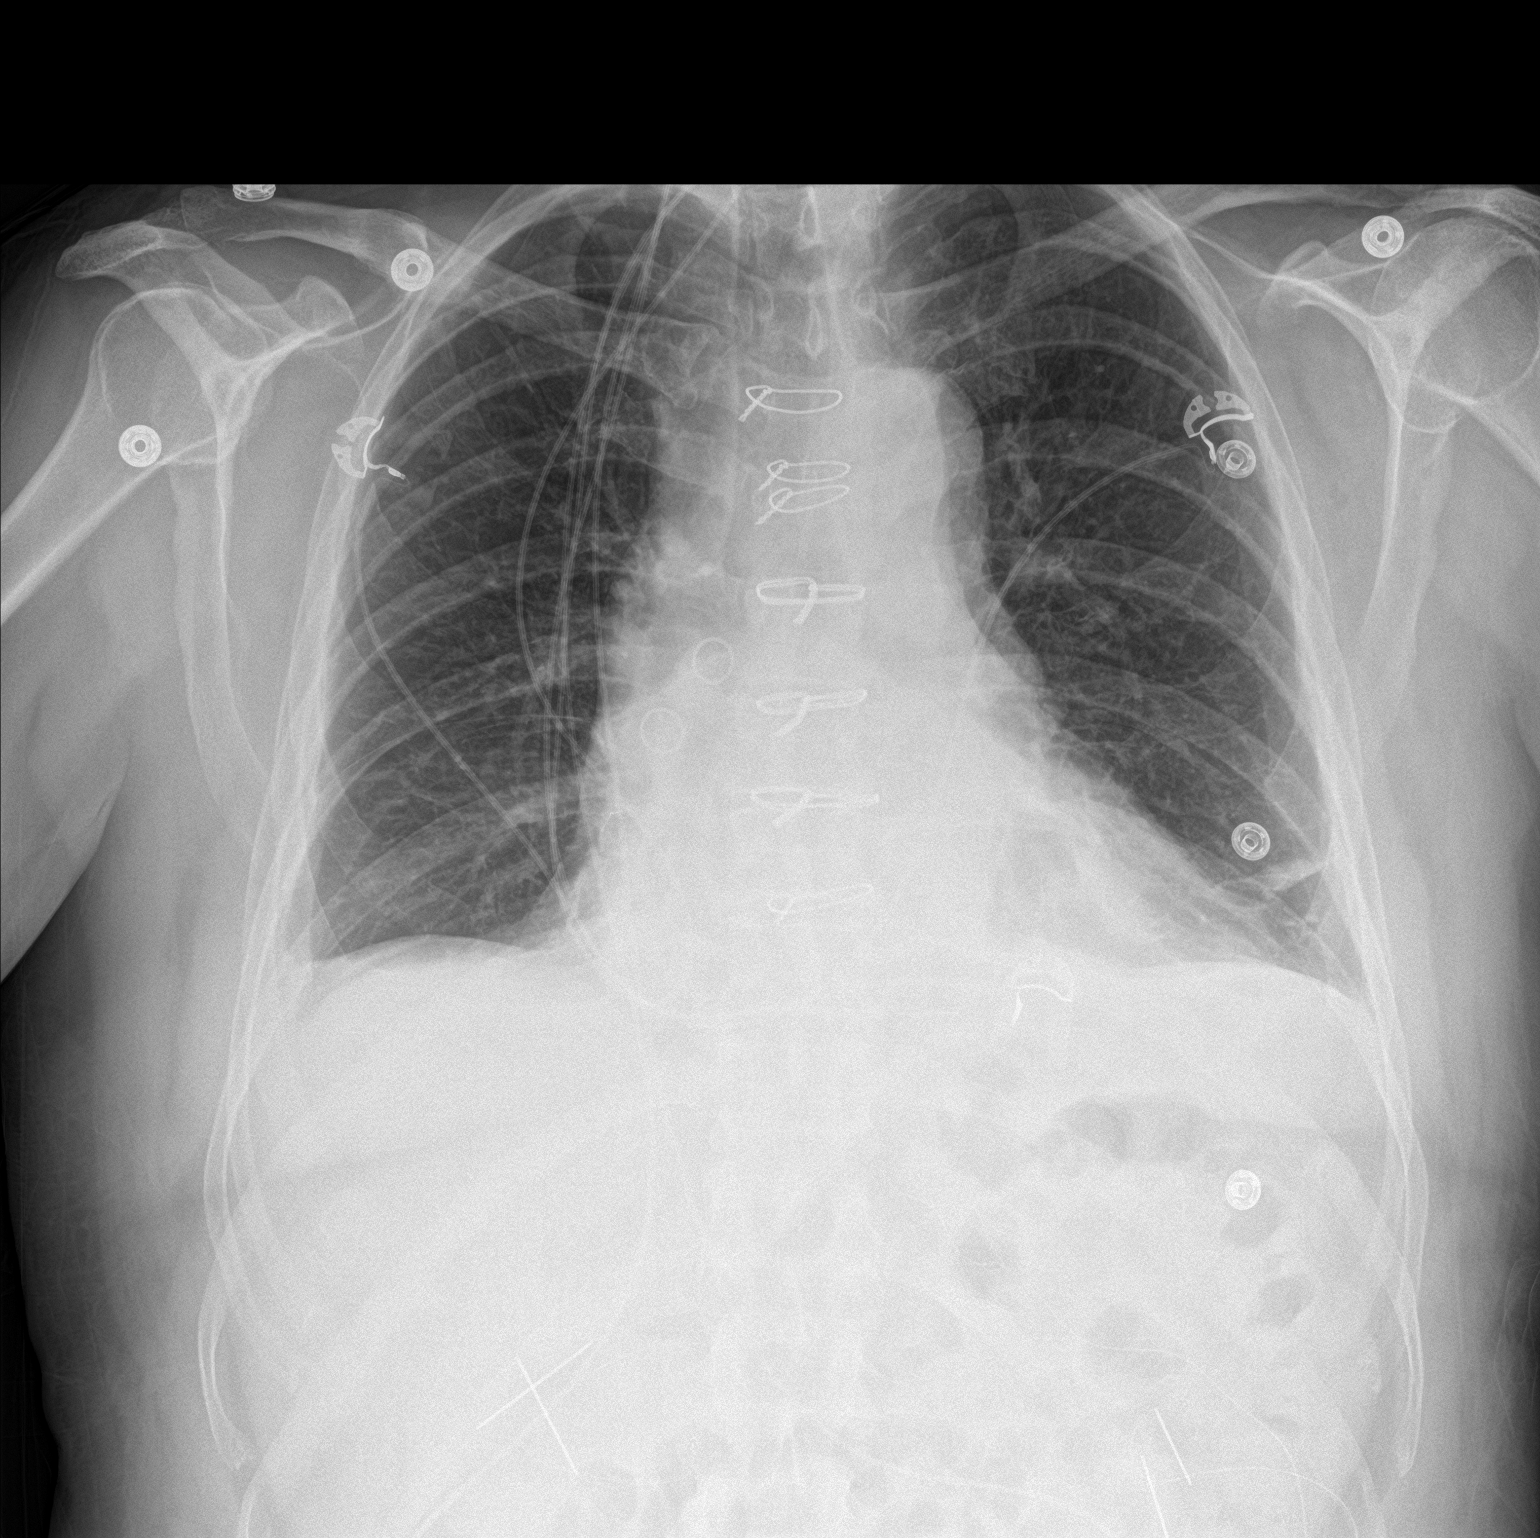

[chest lat]
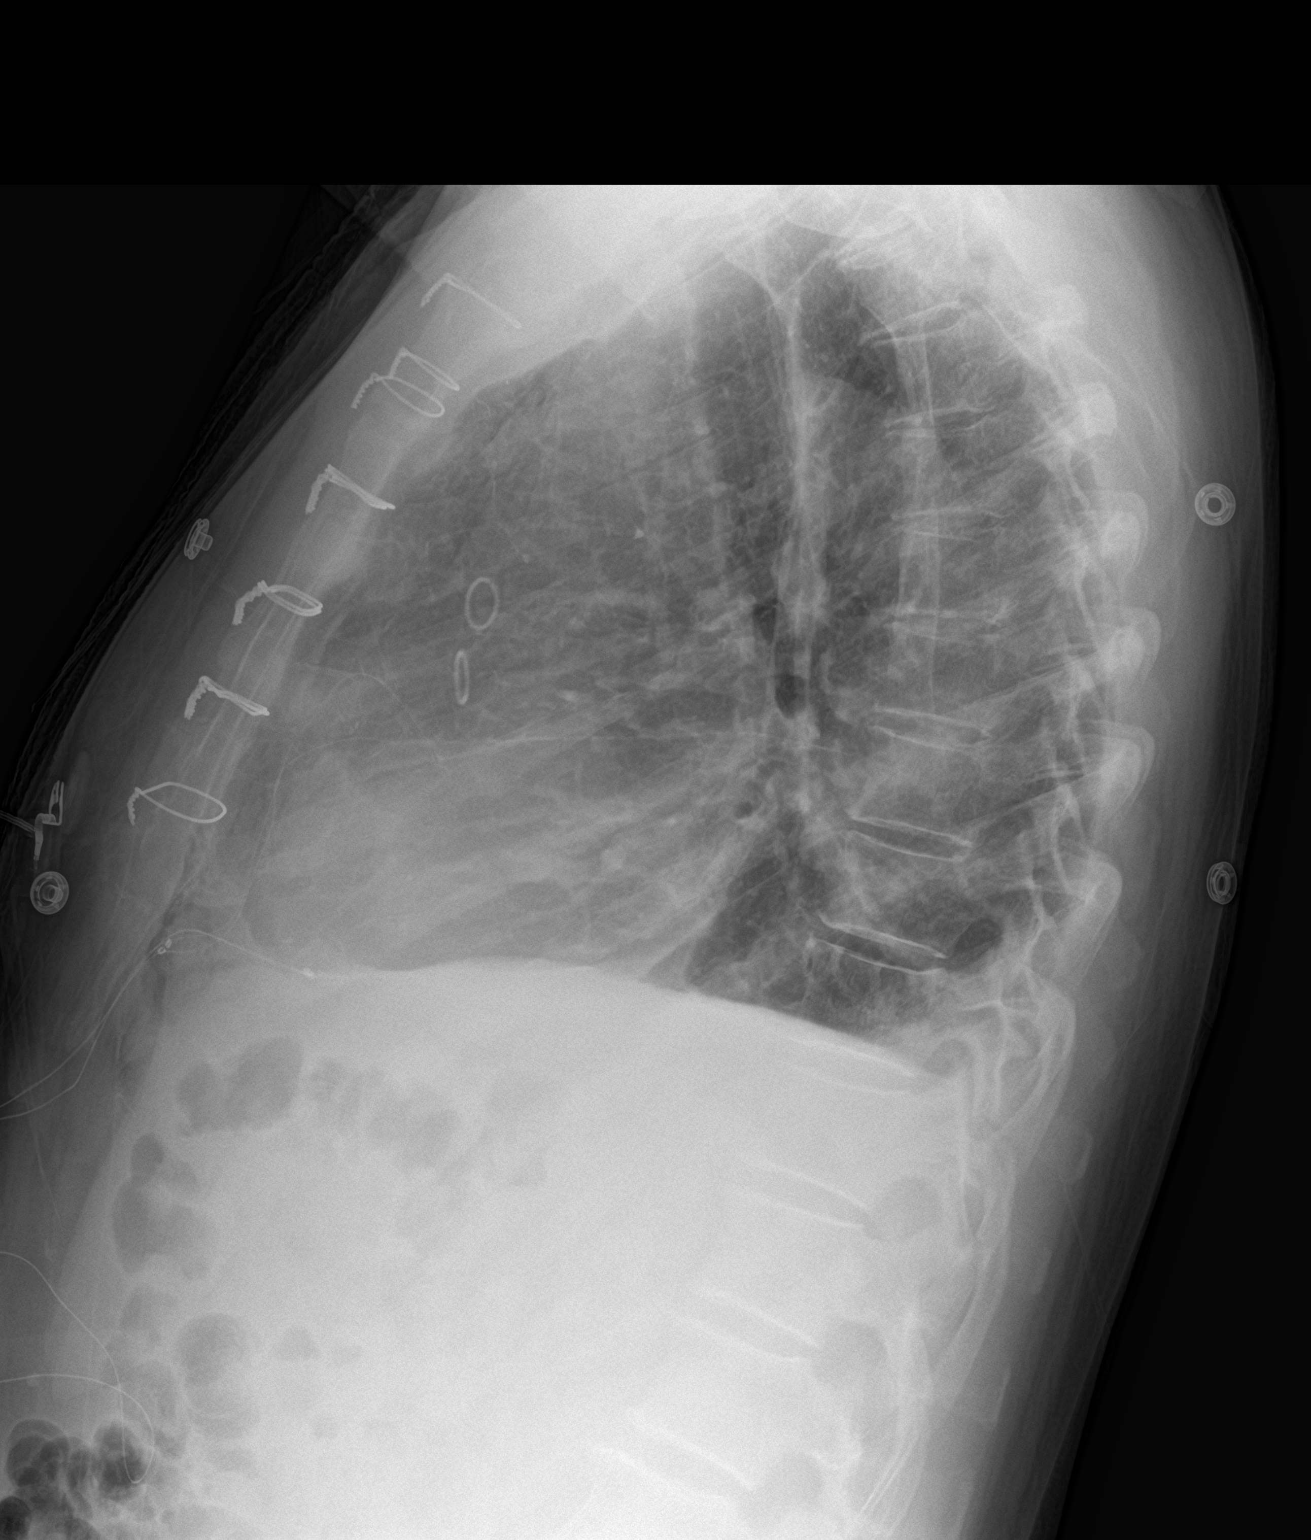

[2 of 2 positions shown; findings below may reference images not displayed]

FINDINGS: Heart size and mediastinal contours are stable. Median sternotomy
wires appear intact and stable in alignment. RIGHT IJ Cordis has
been removed. Mild atelectasis at the LEFT lung base. Lungs
otherwise clear. No pneumothorax seen.
IMPRESSION: Mild atelectasis at the LEFT lung base. Lungs otherwise clear. No
evidence of pneumonia or pulmonary edema.

## 2020-01-07 MED ORDER — ROSUVASTATIN CALCIUM 20 MG PO TABS: 20.0000 mg | ORAL_TABLET | Freq: Every day | ORAL | 3 refills | Status: DC

## 2020-01-07 MED ORDER — LOSARTAN POTASSIUM 25 MG PO TABS: 25.0000 mg | ORAL_TABLET | Freq: Every day | ORAL | 3 refills | Status: DC

## 2020-01-07 MED ORDER — METOPROLOL TARTRATE 25 MG PO TABS: 25.0000 mg | ORAL_TABLET | Freq: Two times a day (BID) | ORAL | 3 refills | Status: DC

## 2020-04-20 ENCOUNTER — Other Ambulatory Visit: Payer: Self-pay

## 2020-04-20 ENCOUNTER — Ambulatory Visit (INDEPENDENT_AMBULATORY_CARE_PROVIDER_SITE_OTHER): Payer: PRIVATE HEALTH INSURANCE | Admitting: Interventional Cardiology

## 2020-04-20 ENCOUNTER — Encounter: Payer: Self-pay | Admitting: Interventional Cardiology

## 2020-04-20 VITALS — BP 120/72 | HR 64 | Ht 71.5 in | Wt 200.0 lb

## 2020-04-20 DIAGNOSIS — I25118 Atherosclerotic heart disease of native coronary artery with other forms of angina pectoris: Secondary | ICD-10-CM

## 2020-04-20 DIAGNOSIS — Z951 Presence of aortocoronary bypass graft: Secondary | ICD-10-CM

## 2020-04-20 DIAGNOSIS — I493 Ventricular premature depolarization: Secondary | ICD-10-CM

## 2020-04-20 DIAGNOSIS — I10 Essential (primary) hypertension: Secondary | ICD-10-CM

## 2020-04-20 NOTE — Patient Instructions (Signed)
Medication Instructions:  Your physician recommends that you continue on your current medications as directed. Please refer to the Current Medication list given to you today.  *If you need a refill on your cardiac medications before your next appointment, please call your pharmacy*   Lab Work: none If you have labs (blood work) drawn today and your tests are completely normal, you will receive your results only by: Marland Kitchen MyChart Message (if you have MyChart) OR . A paper copy in the mail If you have any lab test that is abnormal or we need to change your treatment, we will call you to review the results.   Testing/Procedures: Your physician has requested that you have an abdominal aorta duplex. During this test, an ultrasound is used to evaluate the aorta. Allow 30 minutes for this exam. Do not eat after midnight the day before and avoid carbonated beverages   Follow-Up: At [facility] you and your health needs are our priority.  As part of our continuing mission to provide you with exceptional heart care, we have created designated Provider Care Teams.  These Care Teams include your primary Cardiologist (physician) and Advanced Practice Providers (APPs -  Physician Assistants and Nurse Practitioners) who all work together to provide you with the care you need, when you need it.  We recommend signing up for the patient portal called "MyChart".  Sign up information is provided on this After Visit Summary.  MyChart is used to connect with patients for Virtual Visits (Telemedicine).  Patients are able to view lab/test results, encounter notes, upcoming appointments, etc.  Non-urgent messages can be sent to your provider as well.   To learn more about what you can do with MyChart, go to [email or web address].    Your next appointment:   12 month(s)  The format for your next appointment:   In Person  Provider:   You may see Larae Grooms, MD or one of the following Advanced Practice  Providers on your designated Care Team:    Melina Copa, PA-C  Ermalinda Barrios, PA-C    Other Instructions
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
## 2020-05-04 ENCOUNTER — Other Ambulatory Visit: Payer: Self-pay

## 2020-05-04 ENCOUNTER — Ambulatory Visit (HOSPITAL_COMMUNITY)
Admission: RE | Admit: 2020-05-04 | Discharge: 2020-05-04 | Disposition: A | Discharge status: Home / Self Care | Payer: PRIVATE HEALTH INSURANCE | Source: Ambulatory Visit | Attending: Cardiovascular Disease | Admitting: Cardiovascular Disease

## 2020-05-04 DIAGNOSIS — Z951 Presence of aortocoronary bypass graft: Secondary | ICD-10-CM | POA: Diagnosis not present

## 2020-05-04 DIAGNOSIS — E785 Hyperlipidemia, unspecified: Secondary | ICD-10-CM | POA: Insufficient documentation

## 2020-05-04 DIAGNOSIS — I10 Essential (primary) hypertension: Secondary | ICD-10-CM | POA: Insufficient documentation

## 2020-05-04 DIAGNOSIS — Z8249 Family history of ischemic heart disease and other diseases of the circulatory system: Secondary | ICD-10-CM | POA: Insufficient documentation

## 2020-05-04 DIAGNOSIS — Z87891 Personal history of nicotine dependence: Secondary | ICD-10-CM

## 2020-05-04 DIAGNOSIS — I25118 Atherosclerotic heart disease of native coronary artery with other forms of angina pectoris: Secondary | ICD-10-CM | POA: Diagnosis not present

## 2020-05-04 DIAGNOSIS — Z136 Encounter for screening for cardiovascular disorders: Secondary | ICD-10-CM | POA: Diagnosis not present

## 2020-05-25 ENCOUNTER — Other Ambulatory Visit: Payer: Self-pay | Admitting: *Deleted

## 2020-05-25 DIAGNOSIS — I708 Atherosclerosis of other arteries: Secondary | ICD-10-CM

## 2020-05-25 DIAGNOSIS — I739 Peripheral vascular disease, unspecified: Secondary | ICD-10-CM

## 2021-04-20 ENCOUNTER — Ambulatory Visit: Payer: PRIVATE HEALTH INSURANCE | Admitting: Interventional Cardiology
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
## 2021-05-03 ENCOUNTER — Ambulatory Visit (INDEPENDENT_AMBULATORY_CARE_PROVIDER_SITE_OTHER): Payer: No Typology Code available for payment source | Admitting: Interventional Cardiology

## 2021-05-03 ENCOUNTER — Other Ambulatory Visit: Payer: Self-pay

## 2021-05-03 ENCOUNTER — Encounter: Payer: Self-pay | Admitting: Interventional Cardiology

## 2021-05-03 VITALS — BP 102/60 | HR 55 | Ht 71.5 in | Wt 202.0 lb

## 2021-05-03 DIAGNOSIS — I25118 Atherosclerotic heart disease of native coronary artery with other forms of angina pectoris: Secondary | ICD-10-CM

## 2021-05-03 DIAGNOSIS — I739 Peripheral vascular disease, unspecified: Secondary | ICD-10-CM

## 2021-05-03 DIAGNOSIS — I10 Essential (primary) hypertension: Secondary | ICD-10-CM

## 2021-05-03 DIAGNOSIS — Z951 Presence of aortocoronary bypass graft: Secondary | ICD-10-CM

## 2021-05-03 NOTE — Patient Instructions (Signed)
Medication Instructions:  ?Your physician recommends that you continue on your current medications as directed. Please refer to the Current Medication list given to you today. ? ?*If you need a refill on your cardiac medications before your next appointment, please call your pharmacy* ? ? ?Lab Work: ?none ?If you have labs (blood work) drawn today and your tests are completely normal, you will receive your results only by: ?MyChart Message (if you have MyChart) OR ?A paper copy in the mail ?If you have any lab test that is abnormal or we need to change your treatment, we will call you to review the results. ? ? ?Testing/Procedures: ?Your physician has requested that you have an abdominal aorta duplex. During this test, an ultrasound is used to evaluate the aorta. Allow 30 minutes for this exam. Do not eat after midnight the day before and avoid carbonated beverages ?Scheduled for May 16, 2021 ? ? ?Follow-Up: ?At [facility], you and your health needs are our priority.  As part of our continuing mission to provide you with exceptional heart care, we have created designated Provider Care Teams.  These Care Teams include your primary Cardiologist (physician) and Advanced Practice Providers (APPs -  Physician Assistants and Nurse Practitioners) who all work together to provide you with the care you need, when you need it. ? ?We recommend signing up for the patient portal called "MyChart".  Sign up information is provided on this After Visit Summary.  MyChart is used to connect with patients for Virtual Visits (Telemedicine).  Patients are able to view lab/test results, encounter notes, upcoming appointments, etc.  Non-urgent messages can be sent to your provider as well.   ?To learn more about what you can do with MyChart, go to [email or web address].   ? ?Your next appointment:   ?12 month(s) ? ?The format for your next appointment:   ?In Person ? ?Provider:   ?Larae Grooms, MD   ? ? ?Other  Instructions ? ? ?

## 2021-05-16 ENCOUNTER — Ambulatory Visit (HOSPITAL_COMMUNITY)
Admission: RE | Admit: 2021-05-16 | Discharge: 2021-05-16 | Disposition: A | Discharge status: Home / Self Care | Payer: No Typology Code available for payment source | Source: Ambulatory Visit | Attending: Cardiology | Admitting: Cardiology

## 2021-05-16 ENCOUNTER — Other Ambulatory Visit: Payer: Self-pay

## 2021-05-16 ENCOUNTER — Other Ambulatory Visit: Payer: Self-pay | Admitting: Interventional Cardiology

## 2021-05-16 DIAGNOSIS — I739 Peripheral vascular disease, unspecified: Secondary | ICD-10-CM | POA: Diagnosis not present

## 2021-05-16 DIAGNOSIS — I7789 Other specified disorders of arteries and arterioles: Secondary | ICD-10-CM

## 2021-05-19 ENCOUNTER — Other Ambulatory Visit: Payer: Self-pay | Admitting: *Deleted

## 2021-05-19 DIAGNOSIS — I7789 Other specified disorders of arteries and arterioles: Secondary | ICD-10-CM

## 2022-02-13 ENCOUNTER — Telehealth: Payer: Self-pay | Admitting: Interventional Cardiology

## 2022-02-13 MED ORDER — LOSARTAN POTASSIUM 25 MG PO TABS: 25.0000 mg | ORAL_TABLET | Freq: Every day | ORAL | 0 refills | Status: DC

## 2022-02-13 MED ORDER — ROSUVASTATIN CALCIUM 20 MG PO TABS: 20.0000 mg | ORAL_TABLET | Freq: Every day | ORAL | 0 refills | Status: DC

## 2022-02-13 MED ORDER — METOPROLOL TARTRATE 25 MG PO TABS: 25.0000 mg | ORAL_TABLET | Freq: Two times a day (BID) | ORAL | 0 refills | Status: DC

## 2022-02-13 NOTE — Telephone Encounter (Signed)
*  STAT* If patient is at the pharmacy, call can be transferred to refill team.   1. Which medications need to be refilled? (please list name of each medication and dose if known) losartan (COZAAR) 25 MG tablet ; metoprolol tartrate (LOPRESSOR) 25 MG tablet ;  rosuvastatin (CRESTOR) 20 MG tablet   2. Which pharmacy/location (including street and city if local pharmacy) is medication to be sent to?[place], [place] - [address]    3. Do they need a 30 day or 90 day supply? 90 for all

## 2022-02-13 NOTE — Telephone Encounter (Signed)
Pt's medications was sent to pt's pharmacy as requested. Confirmation received.  

## 2022-02-15 ENCOUNTER — Other Ambulatory Visit: Payer: Self-pay | Admitting: Interventional Cardiology

## 2022-05-02 ENCOUNTER — Other Ambulatory Visit (HOSPITAL_COMMUNITY): Payer: Self-pay | Admitting: Interventional Cardiology

## 2022-05-02 DIAGNOSIS — I7789 Other specified disorders of arteries and arterioles: Secondary | ICD-10-CM

## 2022-05-03 ENCOUNTER — Ambulatory Visit (INDEPENDENT_AMBULATORY_CARE_PROVIDER_SITE_OTHER): Payer: No Typology Code available for payment source

## 2022-05-03 ENCOUNTER — Ambulatory Visit: Payer: No Typology Code available for payment source | Attending: Internal Medicine | Admitting: Internal Medicine

## 2022-05-03 ENCOUNTER — Encounter: Payer: Self-pay | Admitting: Internal Medicine

## 2022-05-03 VITALS — BP 122/70 | HR 57 | Ht 71.5 in | Wt 193.8 lb

## 2022-05-03 DIAGNOSIS — I493 Ventricular premature depolarization: Secondary | ICD-10-CM

## 2022-05-03 DIAGNOSIS — I255 Ischemic cardiomyopathy: Secondary | ICD-10-CM | POA: Diagnosis not present

## 2022-05-03 DIAGNOSIS — I10 Essential (primary) hypertension: Secondary | ICD-10-CM

## 2022-05-03 NOTE — Progress Notes (Signed)
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 Joylene Draft for evaluation of PVC's. He has known CAD with mild LV dysfunction s/p CABG. He is thought to have had a silent MI prior to his CABG for which he did not seek medical attention. He has been treated with bid lopressor. He wore a zio monitor several years ago which showed 7% PVC's. He is minimally if at all symptomatic. He  saw Dr. Christ Kick last a year ago. He denies anginal symptoms and remains active.  Allergies  Allergen Reactions   Oxycodone Nausea And Vomiting    Constipation, problems with voiding     Current Outpatient Medications  Medication Sig Dispense Refill   aspirin EC 81 MG tablet Take 81 mg by mouth every other day.     co-enzyme Q-10 30 MG capsule Take 20 mg by mouth daily.     losartan (COZAAR) 25 MG tablet Take 1 tablet (25 mg total) by mouth daily. 90 tablet 0   metoprolol tartrate (LOPRESSOR) 25 MG tablet Take 1 tablet (25 mg total) by mouth 2 (two) times daily. 180 tablet 0   rosuvastatin (CRESTOR) 20 MG tablet Take 1 tablet (20 mg total) by mouth daily. 90 tablet 0   No current facility-administered medications for this visit.     Past Medical History:  Diagnosis Date   Adenomatous polyp of colon    BPH with urinary obstruction    Coronary artery disease    High frequency hearing loss    History of kidney stones    Hx of colonoscopy    Hyperlipemia    Impacted cerumen of both ears    Kidney stones    Nephrolithiasis    Tuberculosis     ROS:   All systems reviewed and negative except as noted in the HPI.   Past Surgical History:  Procedure Laterality Date   CORONARY ARTERY BYPASS GRAFT N/A 03/12/2018   Procedure: CORONARY ARTERY BYPASS GRAFTING (CABG) x four , using left internal mammary artery to LAD and sequential vein graft  to intermediate and diagonal and vein graft to posterior descending.   right thigh  greater saphenous endovein harvest;  Surgeon: Grace Isaac, MD;  Location: [facility];  Service:  Open Heart Surgery;  Laterality: N/A;   CYSTOSCOPY WITH RETROGRADE PYELOGRAM, URETEROSCOPY AND STENT PLACEMENT Bilateral 10/16/2013   Procedure: CYSTOSCOPY WITH BILATERAL RETROGRADE PYELOGRAM, BILATERAL URETEROSCOPY AND bilateral STENT PLACEMENT, bilateral urethral dilation HOLMIUM LASER;  Surgeon: Claybon Jabs, MD;  [facility];  Service: Urology;  Laterality: Bilateral;   HOLMIUM LASER APPLICATION Bilateral [id number]   Procedure: HOLMIUM LASER APPLICATION;  Surgeon: Claybon Jabs, MD;  Location[facility];  Service: Urology;  Laterality: Bilateral;   LEFT HEART CATH AND CORONARY ANGIOGRAPHY N/A 03/04/2018   Procedure: LEFT HEART CATH AND CORONARY ANGIOGRAPHY;  Surgeon: Jettie Booze, MD;  Location: [facility];  Service: Cardiovascular;  Laterality: N/A;   TEE WITHOUT CARDIOVERSION N/A 03/12/2018   Procedure: TRANSESOPHAGEAL ECHOCARDIOGRAM (TEE);  Surgeon: Grace Isaac, MD;  Location: [facility];  Service: Open Heart Surgery;  Laterality: N/A;     Family History  Problem Relation Age of Onset   Hypertension Mother    Alcohol abuse Mother    Alzheimer's disease Mother    Prostate cancer Father    Colon polyps Father    Heart Problems Father        CABG '@74'$     Alcohol abuse Maternal Grandmother  Heart Problems Maternal Grandfather    AAA (abdominal aortic aneurysm) Maternal Grandfather    Breast cancer Paternal Grandmother        meta.   Alcohol abuse Paternal Grandfather    HIV Brother    Healthy Son    Healthy Daughter    Healthy Daughter    Healthy Grandchild      Social History   Socioeconomic History   Marital status: Married    Spouse name: Not on file   Number of children: Not on file   Years of education: Not on file   Highest education level: Not on file  Occupational History   Not on file  Tobacco Use   Smoking status: Former    Packs/day: .25    Types: Cigarettes   Smokeless tobacco: Never  Vaping Use   Vaping Use: Never used   Substance and Sexual Activity   Alcohol use: Yes    Alcohol/week: 7.0 standard drinks of alcohol    Types: 7 Cans of beer per week   Drug use: No   Sexual activity: Yes  Other Topics Concern   Not on file  Social History Narrative   Not on file   Social Determinants of Health   Financial Resource Strain: Not on file  Food Insecurity: Not on file  Transportation Needs: Not on file  Physical Activity: Not on file  Stress: Not on file  Social Connections: Not on file  Intimate Partner Violence: Not on file     BP 122/70   Pulse (!) 57   Ht 5' 11.5" (1.816 m)   Wt 193 lb 12.8 oz (87.9 kg)   SpO2 98%   BMI 26.65 kg/m   Physical Exam:  Well appearing NAD HEENT: Unremarkable Neck:  No JVD, no thyromegally Lymphatics:  No adenopathy Back:  No CVA tenderness Lungs:  Clear with no wheezes HEART:  IRegular rate rhythm, no murmurs, no rubs, no clicks Abd:  soft, positive bowel sounds, no organomegally, no rebound, no guarding Ext:  2 plus pulses, no edema, no cyanosis, no clubbing Skin:  No rashes no nodules Neuro:  CN II through XII intact, motor grossly intact  EKG - nsr with a single PJC vs PVC  Assess/Plan: PVC's - he is minimally symptomatic. I have recommended he wear a 7 day zio monitor and additional recs will depend on the results of the monitor.  HTN - his bp is controlled. CAD - he is s/p CABG with no anginal symptoms.   Carleene Overlie Aaryn Parrilla,MD

## 2022-05-03 NOTE — Progress Notes (Unsigned)
Enrolled patient for a 7 day Zio XT monitor to be mailed to patients home.  

## 2022-05-03 NOTE — Patient Instructions (Addendum)
Medication Instructions:  Your physician recommends that you continue on your current medications as directed. Please refer to the Current Medication list given to you today.  *If you need a refill on your cardiac medications before your next appointment, please call your pharmacy*  Lab Work: None ordered.  If you have labs (blood work) drawn today and your tests are completely normal, you will receive your results only by: [place] (if you have MyChart) OR A paper copy in the mail If you have any lab test that is abnormal or we need to change your treatment, we will call you to review the results.  Testing/Procedures: None ordered.  Follow-Up: Dr. Cristopher Peru has ordered a 7 Day Zio Heart Monitor for PVC's.  This monitor will be mailed to your home.  If you have any questions or concerns, please call us at [phone].  We will schedule a follow up appointment once the Zio heart monitor results are received.    ZIO XT- Long Term Monitor Instructions  Your physician has requested you wear a ZIO patch monitor for 14 days.  This is a single patch monitor. [employer] supplies one patch monitor per enrollment. Additional stickers are not available. Please do not apply patch if you will be having a Nuclear Stress Test,  Echocardiogram, Cardiac CT, MRI, or Chest Xray during the period you would be wearing the  monitor. The patch cannot be worn during these tests. You cannot remove and re-apply the  ZIO XT patch monitor.  Your ZIO patch monitor will be mailed 3 day USPS to your address on file. It may take 3-5 days  to receive your monitor after you have been enrolled.  Once you have received your monitor, please review the enclosed instructions. Your monitor  has already been registered assigning a specific monitor serial # to you.  Billing and Patient Assistance Program Information  We have supplied [employer] with any of your insurance information on file for billing  purposes. [employer] offers a sliding scale Patient Assistance Program for patients that do not have  insurance, or whose insurance does not completely cover the cost of the ZIO monitor.  You must apply for the Patient Assistance Program to qualify for this discounted rate.  To apply, please call [employer] at [phone], select option 4, select option 2, ask to apply for  Patient Assistance Program. Theodore Demark will ask your household income, and how many people  are in your household. They will quote your out-of-pocket cost based on that information.  [employer] will also be able to set up a 34-month interest-free payment plan if needed.  Applying the monitor   Shave hair from upper left chest.  Hold abrader disc by orange tab. Rub abrader in 40 strokes over the upper left chest as  indicated in your monitor instructions.  Clean area with 4 enclosed alcohol pads. Let dry.  Apply patch as indicated in monitor instructions. Patch will be placed under collarbone on left  side of chest with arrow pointing upward.  Rub patch adhesive wings for 2 minutes. Remove white label marked "1". Remove the white  label marked "2". Rub patch adhesive wings for 2 additional minutes.  While looking in a mirror, press and release button in center of patch. A small green light will  flash 3-4 times. This will be your only indicator that the monitor has been turned on.  Do not shower for the first 24 hours. You may shower after the first 24 hours.  Press  the button if you feel a symptom. You will hear a small click. Record Date, Time and  Symptom in the Patient Logbook.  When you are ready to remove the patch, follow instructions on the last 2 pages of Patient  Logbook. Stick patch monitor onto the last page of Patient Logbook.  Place Patient Logbook in the blue and white box. Use locking tab on box and tape box closed  securely. The blue and white box has prepaid postage on it. Please place it in the mailbox as  soon  as possible. Your physician should have your test results approximately 7 days after the  monitor has been mailed back to [facility].  Call [place] at [phone] if you have questions regarding  your ZIO XT patch monitor. Call them immediately if you see an orange light blinking on your  monitor.  If your monitor falls off in less than 4 days, contact our Monitor department at [phone].  If your monitor becomes loose or falls off after 4 days call [employer] at [phone] for  suggestions on securing your monitor

## 2022-05-12 ENCOUNTER — Other Ambulatory Visit: Payer: Self-pay | Admitting: Interventional Cardiology
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
## 2022-05-16 ENCOUNTER — Encounter: Payer: Self-pay | Admitting: Interventional Cardiology

## 2022-05-16 ENCOUNTER — Ambulatory Visit
Payer: No Typology Code available for payment source | Attending: Interventional Cardiology | Admitting: Interventional Cardiology

## 2022-05-16 VITALS — BP 120/78 | HR 64 | Ht 71.0 in | Wt 187.2 lb

## 2022-05-16 DIAGNOSIS — I25118 Atherosclerotic heart disease of native coronary artery with other forms of angina pectoris: Secondary | ICD-10-CM | POA: Diagnosis not present

## 2022-05-16 DIAGNOSIS — I493 Ventricular premature depolarization: Secondary | ICD-10-CM

## 2022-05-16 DIAGNOSIS — Z951 Presence of aortocoronary bypass graft: Secondary | ICD-10-CM

## 2022-05-16 DIAGNOSIS — I7789 Other specified disorders of arteries and arterioles: Secondary | ICD-10-CM

## 2022-05-16 DIAGNOSIS — I10 Essential (primary) hypertension: Secondary | ICD-10-CM

## 2022-05-16 NOTE — Patient Instructions (Signed)
Medication Instructions:  No Changes *If you need a refill on your cardiac medications before your next appointment, please call your pharmacy*   Lab Work: None Ordered  If you have labs (blood work) drawn today and your tests are completely normal, you will receive your results only by: [place] (if you have MyChart) OR A paper copy in the mail If you have any lab test that is abnormal or we need to change your treatment, we will call you to review the results.   Testing/Procedures: None Ordered.   Follow-Up: At [facility], you and your health needs are our priority.  As part of our continuing mission to provide you with exceptional heart care, we have created designated Provider Care Teams.  These Care Teams include your primary Cardiologist (physician) and Advanced Practice Providers (APPs -  Physician Assistants and Nurse Practitioners) who all work together to provide you with the care you need, when you need it.  We recommend signing up for the patient portal called "MyChart".  Sign up information is provided on this After Visit Summary.  MyChart is used to connect with patients for Virtual Visits (Telemedicine).  Patients are able to view lab/test results, encounter notes, upcoming appointments, etc.  Non-urgent messages can be sent to your provider as well.   To learn more about what you can do with MyChart, go to [email or web address].    Your next appointment:   1 year(s)  Provider:   Larae Grooms, MD

## 2022-05-23 ENCOUNTER — Ambulatory Visit (HOSPITAL_COMMUNITY)
Admission: RE | Admit: 2022-05-23 | Discharge: 2022-05-23 | Disposition: A | Discharge status: Home / Self Care | Payer: No Typology Code available for payment source | Source: Ambulatory Visit | Attending: Cardiology | Admitting: Cardiology

## 2022-05-23 DIAGNOSIS — Z87891 Personal history of nicotine dependence: Secondary | ICD-10-CM | POA: Insufficient documentation

## 2022-05-23 DIAGNOSIS — E785 Hyperlipidemia, unspecified: Secondary | ICD-10-CM | POA: Insufficient documentation

## 2022-05-23 DIAGNOSIS — I2510 Atherosclerotic heart disease of native coronary artery without angina pectoris: Secondary | ICD-10-CM | POA: Insufficient documentation

## 2022-05-23 DIAGNOSIS — I7789 Other specified disorders of arteries and arterioles: Secondary | ICD-10-CM | POA: Insufficient documentation

## 2022-05-23 DIAGNOSIS — Z951 Presence of aortocoronary bypass graft: Secondary | ICD-10-CM | POA: Insufficient documentation

## 2022-05-23 DIAGNOSIS — I10 Essential (primary) hypertension: Secondary | ICD-10-CM | POA: Insufficient documentation

## 2022-05-29 ENCOUNTER — Other Ambulatory Visit: Payer: Self-pay | Admitting: *Deleted

## 2022-05-29 DIAGNOSIS — I7789 Other specified disorders of arteries and arterioles: Secondary | ICD-10-CM

## 2022-06-11 ENCOUNTER — Telehealth: Payer: Self-pay

## 2022-08-08 ENCOUNTER — Other Ambulatory Visit: Payer: Self-pay | Admitting: Interventional Cardiology

## 2022-08-16 NOTE — Telephone Encounter (Signed)
Completed.

## 2023-02-23 ENCOUNTER — Other Ambulatory Visit: Payer: Self-pay | Admitting: Interventional Cardiology

## 2023-05-24 ENCOUNTER — Other Ambulatory Visit: Payer: Self-pay | Admitting: Interventional Cardiology

## 2023-05-24 ENCOUNTER — Other Ambulatory Visit: Payer: Self-pay | Admitting: Cardiovascular Disease

## 2023-05-31 ENCOUNTER — Other Ambulatory Visit: Payer: Self-pay | Admitting: *Deleted

## 2023-05-31 DIAGNOSIS — I7789 Other specified disorders of arteries and arterioles: Secondary | ICD-10-CM

## 2023-07-03 ENCOUNTER — Ambulatory Visit (HOSPITAL_COMMUNITY)
Admission: RE | Admit: 2023-07-03 | Discharge: 2023-07-03 | Disposition: A | Discharge status: Home / Self Care | Source: Ambulatory Visit | Attending: Vascular Surgery | Admitting: Vascular Surgery

## 2023-07-03 DIAGNOSIS — I723 Aneurysm of iliac artery: Secondary | ICD-10-CM | POA: Diagnosis not present

## 2023-07-03 DIAGNOSIS — Z87891 Personal history of nicotine dependence: Secondary | ICD-10-CM | POA: Diagnosis not present

## 2023-07-03 DIAGNOSIS — E785 Hyperlipidemia, unspecified: Secondary | ICD-10-CM | POA: Insufficient documentation

## 2023-07-03 DIAGNOSIS — I2510 Atherosclerotic heart disease of native coronary artery without angina pectoris: Secondary | ICD-10-CM | POA: Insufficient documentation

## 2023-07-03 DIAGNOSIS — I7789 Other specified disorders of arteries and arterioles: Secondary | ICD-10-CM | POA: Diagnosis not present

## 2023-07-04 ENCOUNTER — Ambulatory Visit: Payer: Self-pay | Admitting: Cardiology

## 2023-12-05 ENCOUNTER — Encounter: Payer: Self-pay | Admitting: Internal Medicine

## 2023-12-11 ENCOUNTER — Encounter: Payer: Self-pay | Admitting: Internal Medicine

## 2023-12-11 ENCOUNTER — Ambulatory Visit: Payer: Self-pay | Attending: Internal Medicine | Admitting: Internal Medicine

## 2023-12-11 VITALS — BP 133/85 | HR 66 | Ht 71.0 in | Wt 176.0 lb

## 2023-12-11 DIAGNOSIS — I25118 Atherosclerotic heart disease of native coronary artery with other forms of angina pectoris: Secondary | ICD-10-CM | POA: Diagnosis not present

## 2023-12-11 NOTE — Patient Instructions (Addendum)
 Medication Instructions:  Your physician recommends that you continue on your current medications as directed. Please refer to the Current Medication list given to you today.  *If you need a refill on your cardiac medications before your next appointment, please call your pharmacy*  Lab Work: None ordered.  You may go to any [facility] Location for your lab work:  [facility] - [address] ([facility]) - [address] [phone] [address]  [place] - [address]   [place]  - [address]   [place] - [address] - [address] [employer]  - [address] ([facility]   If you have labs (blood work) drawn today and your tests are completely normal, you will receive your results only by: [employer] (if you have MyChart)  If you have any lab test that is abnormal or we need to change your treatment, we will call you or send a MyChart message to review the results.  Testing/Procedures: None ordered.  Follow-Up: At [facility], you and your health needs are our priority.  As part of our continuing mission to provide you with exceptional heart care, we have created designated Provider Care Teams.  These Care Teams include your primary Cardiologist (physician) and Advanced Practice Providers (APPs -  Physician Assistants and Nurse Practitioners) who all work together to provide you with the care you need, when you need it.    Your next appointment:   June with Kate

## 2023-12-11 NOTE — Progress Notes (Signed)
 HPI Dr. Liam returns today for followup. He is a pleasant 68 yo man with CAD and mild LV dysfunction, s/p CABG. I saw him in consultation about 19 months ago and he had 7% PVC burden on zio monitor. He has been on metoprolol  and feels well. He uses a Optician, dispensing mobile and his PVC's have improved over the past 6 months. Essentially no additional bigeminy. He denies chest pain or sob or syncope.  Allergies  Allergen Reactions   Oxycodone  Nausea And Vomiting    Constipation, problems with voiding     Current Outpatient Medications  Medication Sig Dispense Refill   aspirin  EC 81 MG tablet Take 81 mg by mouth every other day.     co-enzyme Q-10 30 MG capsule Take 20 mg by mouth daily.     losartan  (COZAAR ) 25 MG tablet Take 1 tablet (25 mg total) by mouth daily. 30 tablet 0   metoprolol  tartrate (LOPRESSOR ) 25 MG tablet Take 1 tablet by mouth twice daily 60 tablet 0   rosuvastatin  (CRESTOR ) 20 MG tablet Take 1 tablet by mouth once daily 90 tablet 3   No current facility-administered medications for this visit.     Past Medical History:  Diagnosis Date   Adenomatous polyp of colon    BPH with urinary obstruction    Coronary artery disease    High frequency hearing loss    History of kidney stones    Hx of colonoscopy    Hyperlipemia    Impacted cerumen of both ears    Kidney stones    Nephrolithiasis    Tuberculosis     ROS:   All systems reviewed and negative except as noted in the HPI.   Past Surgical History:  Procedure Laterality Date   CORONARY ARTERY BYPASS GRAFT N/A 03/12/2018   Procedure: CORONARY ARTERY BYPASS GRAFTING (CABG) x four , using left internal mammary artery to LAD and sequential vein graft  to intermediate and diagonal and vein graft to posterior descending.   right thigh  greater saphenous endovein harvest;  Surgeon: Army Dallas NOVAK, MD;  Location: [facility];  Service: Open Heart Surgery;  Laterality: N/A;   CYSTOSCOPY WITH RETROGRADE PYELOGRAM,  URETEROSCOPY AND STENT PLACEMENT Bilateral 10/16/2013   Procedure: CYSTOSCOPY WITH BILATERAL RETROGRADE PYELOGRAM, BILATERAL URETEROSCOPY AND bilateral STENT PLACEMENT, bilateral urethral dilation HOLMIUM LASER;  Surgeon: Mark C Ottelin, MD;  Location[facility];  Service: Urology;  Laterality: Bilateral;   HOLMIUM LASER APPLICATION Bilateral 10/16/2013   Procedure: HOLMIUM LASER APPLICATION;  Surgeon: Oneil JAYSON Rafter, MD;  Location: [facility];  Service: Urology;  Laterality: Bilateral;   LEFT HEART CATH AND CORONARY ANGIOGRAPHY N/A 03/04/2018   Procedure: LEFT HEART CATH AND CORONARY ANGIOGRAPHY;  Surgeon: Dann Candyce RAMAN, MD;  Location: [facility];  Service: Cardiovascular;  Laterality: N/A;   TEE WITHOUT CARDIOVERSION N/A 03/12/2018   Procedure: TRANSESOPHAGEAL ECHOCARDIOGRAM (TEE);  Surgeon: Army Dallas NOVAK, MD;  Location: [facility];  Service: Open Heart Surgery;  Laterality: N/A;     Family History  Problem Relation Age of Onset   Hypertension Mother    Alcohol abuse Mother    Alzheimer's disease Mother    Prostate cancer Father    Colon polyps Father    Heart Problems Father        CABG @76     Alcohol abuse Maternal Grandmother    Heart Problems Maternal Grandfather    AAA (abdominal aortic aneurysm) Maternal Grandfather    Breast  cancer Paternal Grandmother        meta.   Alcohol abuse Paternal Grandfather    HIV Brother    Healthy Son    Healthy Daughter    Healthy Daughter    Healthy Grandchild      Social History   Socioeconomic History   Marital status: Married    Spouse name: Not on file   Number of children: Not on file   Years of education: Not on file   Highest education level: Not on file  Occupational History   Not on file  Tobacco Use   Smoking status: Former    Current packs/day: 0.25    Types: Cigarettes   Smokeless tobacco: Never  Vaping Use   Vaping status: Never Used  Substance and Sexual Activity   Alcohol use: Yes    Alcohol/week: 7.0  standard drinks of alcohol    Types: 7 Cans of beer per week   Drug use: No   Sexual activity: Yes  Other Topics Concern   Not on file  Social History Narrative   Not on file   Social Drivers of Health   Financial Resource Strain: Not on file  Food Insecurity: Not on file  Transportation Needs: Not on file  Physical Activity: Not on file  Stress: Not on file  Social Connections: Not on file  Intimate Partner Violence: Not on file     BP 133/85   Pulse 66   Ht 5' 11 (1.803 m)   Wt 176 lb (79.8 kg)   SpO2 95%   BMI 24.55 kg/m   Physical Exam:  Well appearing NAD HEENT: Unremarkable Neck:  No JVD, no thyromegally Lymphatics:  No adenopathy Back:  No CVA tenderness Lungs:  Clear HEART:  Regular rate rhythm, no murmurs, no rubs, no clicks Abd:  soft, positive bowel sounds, no organomegally, no rebound, no guarding Ext:  2 plus pulses, no edema, no cyanosis, no clubbing Skin:  No rashes no nodules Neuro:  CN II through XII intact, motor grossly intact  EKG - nsr with IMI, and a single PVC.   Assess/Plan: CAD - he denies anginal symptoms. I will have him follow with Dr. Kate in Dr. Johnice absence. PVC - he is asymptomatic and appears well controlled. I will have him followup prn with EP.  Dyslipidemia - he will continue his statin therapy.   Danelle Samah Lapiana,MD
